# Patient Record
Sex: Female | Born: 1955 | Race: Black or African American | Hispanic: No | Marital: Single | Smoking: Never smoker
Health system: Southern US, Community
[De-identification: ages and names within clinical notes are randomized; demographics above are authoritative.]

## PROBLEM LIST (undated history)

## (undated) DIAGNOSIS — E559 Vitamin D deficiency, unspecified: Secondary | ICD-10-CM

## (undated) HISTORY — PX: CHOLECYSTECTOMY: SHX55

## (undated) HISTORY — PX: ENDOMETRIAL ABLATION: SHX621

## (undated) HISTORY — DX: Vitamin D deficiency, unspecified: E55.9

---

## 2001-05-05 ENCOUNTER — Emergency Department (HOSPITAL_COMMUNITY): Admission: EM | Admit: 2001-05-05 | Discharge: 2001-05-06 | Payer: Self-pay | Admitting: *Deleted

## 2001-05-06 ENCOUNTER — Encounter: Payer: Self-pay | Admitting: *Deleted

## 2003-08-14 ENCOUNTER — Encounter: Admission: RE | Admit: 2003-08-14 | Discharge: 2003-08-14 | Payer: Self-pay | Admitting: Gynecology

## 2003-08-14 ENCOUNTER — Other Ambulatory Visit: Admission: RE | Admit: 2003-08-14 | Discharge: 2003-08-14 | Payer: Self-pay | Admitting: Gynecology

## 2003-09-11 ENCOUNTER — Ambulatory Visit (HOSPITAL_COMMUNITY): Admission: RE | Admit: 2003-09-11 | Discharge: 2003-09-11 | Payer: Self-pay | Admitting: Gastroenterology

## 2003-09-15 ENCOUNTER — Ambulatory Visit (HOSPITAL_COMMUNITY): Admission: RE | Admit: 2003-09-15 | Discharge: 2003-09-15 | Payer: Self-pay | Admitting: Gynecology

## 2003-09-15 ENCOUNTER — Ambulatory Visit (HOSPITAL_BASED_OUTPATIENT_CLINIC_OR_DEPARTMENT_OTHER): Admission: RE | Admit: 2003-09-15 | Discharge: 2003-09-15 | Payer: Self-pay | Admitting: Gynecology

## 2008-04-03 ENCOUNTER — Encounter: Admission: RE | Admit: 2008-04-03 | Discharge: 2008-04-03 | Payer: Self-pay | Admitting: Otolaryngology

## 2009-10-09 ENCOUNTER — Other Ambulatory Visit: Admission: RE | Admit: 2009-10-09 | Discharge: 2009-10-09 | Payer: Self-pay | Admitting: Gynecology

## 2009-10-09 ENCOUNTER — Ambulatory Visit: Payer: Self-pay | Admitting: Gynecology

## 2009-10-12 ENCOUNTER — Ambulatory Visit: Payer: Self-pay | Admitting: Gynecology

## 2009-11-20 ENCOUNTER — Ambulatory Visit: Payer: Self-pay | Admitting: Gynecology

## 2010-10-14 ENCOUNTER — Other Ambulatory Visit: Payer: Self-pay | Admitting: Gynecology

## 2010-10-14 ENCOUNTER — Encounter (INDEPENDENT_AMBULATORY_CARE_PROVIDER_SITE_OTHER): Payer: 59 | Admitting: Gynecology

## 2010-10-14 ENCOUNTER — Other Ambulatory Visit (HOSPITAL_COMMUNITY)
Admission: RE | Admit: 2010-10-14 | Discharge: 2010-10-14 | Disposition: A | Discharge status: Home / Self Care | Payer: 59 | Source: Ambulatory Visit | Attending: Gynecology | Admitting: Gynecology

## 2010-10-14 DIAGNOSIS — Z124 Encounter for screening for malignant neoplasm of cervix: Secondary | ICD-10-CM | POA: Insufficient documentation

## 2010-10-14 DIAGNOSIS — Z1211 Encounter for screening for malignant neoplasm of colon: Secondary | ICD-10-CM

## 2010-10-14 DIAGNOSIS — Z01419 Encounter for gynecological examination (general) (routine) without abnormal findings: Secondary | ICD-10-CM

## 2011-09-23 ENCOUNTER — Encounter: Payer: Self-pay | Admitting: Gynecology

## 2011-10-15 ENCOUNTER — Encounter: Payer: Self-pay | Admitting: Gynecology

## 2011-10-15 ENCOUNTER — Ambulatory Visit (INDEPENDENT_AMBULATORY_CARE_PROVIDER_SITE_OTHER): Payer: 59 | Admitting: Gynecology

## 2011-10-15 VITALS — BP 130/86 | Ht 63.5 in | Wt 183.0 lb

## 2011-10-15 DIAGNOSIS — Z01419 Encounter for gynecological examination (general) (routine) without abnormal findings: Secondary | ICD-10-CM

## 2011-10-15 DIAGNOSIS — N898 Other specified noninflammatory disorders of vagina: Secondary | ICD-10-CM

## 2011-10-15 DIAGNOSIS — E119 Type 2 diabetes mellitus without complications: Secondary | ICD-10-CM | POA: Insufficient documentation

## 2011-10-15 LAB — WET PREP FOR TRICH, YEAST, CLUE
Clue Cells Wet Prep HPF POC: NONE SEEN
Trich, Wet Prep: NONE SEEN
Yeast Wet Prep HPF POC: NONE SEEN

## 2011-10-15 NOTE — Patient Instructions (Signed)
Patient information: High cholesterol (The Basics)  What is cholesterol? -- Cholesterol is a substance that is found in the blood. Everyone has some. It is needed for good health. The problem is, people sometimes have too much cholesterol. Compared with people with normal cholesterol, people with high cholesterol have a higher risk of heart attacks, strokes, and other health problems. The higher your cholesterol, the higher your risk of these problems.  Are there different types of cholesterol? -- Yes, there are a few different types. If you get a cholesterol test, you may hear your doctor or nurse talk about: Total cholesterol  LDL cholesterol - Some people call this the "bad" cholesterol. That's because having high LDL levels raises your risk of heart attacks, strokes, and other health problems.  HDL cholesterol - Some people call this the "good" cholesterol. That's because having high HDL levels lowers your risk of heart attacks, strokes, and other health problems.  Non-HDL cholesterol - Non-HDL cholesterol is your total cholesterol minus your HDL cholesterol.  Triglycerides - Triglycerides are not cholesterol. They are a type of fat. But they often get measured when cholesterol is measured. (Having high triglycerides also seems to increase the risk of heart attacks and strokes.)  What should my numbers be? -- Ask your doctor or nurse what your numbers should be. Different people need different goals. (If you live outside the [place], see (table 1)). In general, people who do not already have heart disease should aim for: Total cholesterol below 200  LDL cholesterol below 130 - or much lower, if they are at risk of heart attacks or strokes  HDL cholesterol above 60  Non-HDL cholesterol below 160 - or lower, if they are at risk of heart attacks or strokes  Triglycerides below 150 Keep in mind, though, that many people who cannot meet these goals still have a low risk of  heart attacks and strokes. What should I do if my doctor tells me I have high cholesterol? -- Ask your doctor what your overall risk of heart attacks and strokes is. High cholesterol, by itself, is not always a reason to worry. Having high cholesterol is just one of many things that can increase your risk of heart attacks and strokes. Other factors that increase your risk include:  Cigarette smoking  High blood pressure  Having a parent, sister, or brother who got heart disease at a young age (Young, in this case, means younger than 3 for men and younger than 30 for women.)  Being a man (Women are at risk, too, but men have a higher risk.)  Older age  If you are at high risk of heart attacks and strokes, having high cholesterol is a problem. On the other hand, if you have are at low risk, having high cholesterol may not mean much. Should I take medicine to lower cholesterol? -- Not everyone who has high cholesterol needs medicines. Your doctor or nurse will decide if you need them based on your age, family history, and other health concerns.  You should probably take a cholesterol-lowering medicine called a statin if you: Already had a heart attack or stroke  Have known heart disease  Have diabetes  Have a condition called peripheral artery disease, which makes it painful to walk, and happens when the arteries in your legs get clogged with fatty deposits  Have an abdominal aortic aneurysm, which is a widening of the main artery in the belly  Most people with any of the conditions  listed above should take a statin no matter what their cholesterol level is. If your doctor or nurse puts you on a statin, stay on it. The medicine may not make you feel any different. But it can help prevent heart attacks, strokes, and death.  Can I lower my cholesterol without medicines? -- Yes, you can lower your cholesterol some by:  Avoiding red meat, butter, fried foods, cheese, and other foods that have a lot of  saturated fat  Losing weight (if you are overweight)  Being more active Even if these steps do little to change your cholesterol, they can improve your health in many ways.                                                         Patient information: Preventing type 2 diabetes (The Basics)  Can type 2 diabetes be prevented? -- Yes! Studies show that people who are at risk can prevent type 2 diabetes by:  Losing weight (if they are overweight)  Being active  Improving the way they eat  Taking certain medicines (most often one called metformin)  There's some evidence that quitting smoking also lowers the risk of developing type 2 diabetes, but scientists need to do more studies to be sure. Even so, there are plenty of good reasons to quit. Quitting smoking lowers your risk of stroke, heart disease, and lots of other problems.  What increases my risk for type 2 diabetes? -- There are a few factors that can increase your risk of diabetes, including: Being overweight or obese, especially if you carry your extra weight in your belly (as opposed to in your hips, thighs, and butt)  Not doing enough physical activity  Smoking  Having a family history of diabetes  Having diabetes during pregnancy, called "gestational diabetes" (if you are a woman) Plus, Asian, Latino, or black people are more likely to get diabetes than white people.  Are there tests that can find people who are at risk? -- Yes. There are 3 different tests that can help doctors tell whether a person might develop type 2 diabetes. All 3 tests measure blood sugar in different ways. "Blood glucose" is another name for blood sugar.  Even though these tests can help predict diabetes, they are not appropriate for everyone. Your doctor or nurse will decide if one of these tests is right for you. Often, people who get tested are overweight and have one other risk factor for diabetes. Examples of risk factors include having a history of diabetes  during pregnancy or a family history of diabetes.  If a blood test shows that a person's blood sugar is higher than normal but not high enough to be called diabetes, doctors call it "pre-diabetes." People with pre-diabetes are at high risk of developing diabetes.  Fasting glucose test - This test measures your blood sugar when you have not had anything to eat or drink (except water) for 8 hours. People with pre-diabetes have a fasting glucose between 100 and 125 . Glucose tolerance test - For this test you do not eat or drink anything for 8 to 12 hours. But then, as part of the test, you have a sugary drink. Two hours later, a doctor or nurse takes a blood sample to see how high your blood sugar got. People  with prediabetes have glucose tolerance results between 140 and 199 .  Hemoglobin A1C test (also called HbA1C or A1C) - For this test it does not matter whether you eat beforehand. It is a blood test that shows what your average blood sugar level has been for the past 2 to 3 months. People with pre-diabetes have A1C levels between 5.7 and 6.4.  What should I do if I have pre-diabetes? -- If you have pre-diabetes, make lifestyle changes to reduce the chance that you will get full-blown diabetes. Here's what you should do:  Lose weight - Losing 5 to 10 percent of your body weight can lower your risk a lot. If you weigh 200 pounds, that means you should lose 10 to 20 pounds. If you weigh 150 pounds, that means you should lose 7 to 15 pounds.  Eat right - Choose a diet rich in fruits, vegetables, and low-fat dairy products, but low in meats, sweets, and refined grains. Stay away from sweet drinks, like soda and juice.  Be active for 30 minutes a day - You don't have to go to the gym or break a sweat to get a benefit. Walking, gardening, and dancing are all activities that can help.  Quit smoking - If you smoke, ask your doctor or nurse for advice on how to quit. People are much more likely to succeed if they  have help and get medicines to help them quit.  Take your medicines -- If your doctor or nurse prescribed any medicines, take them every day, as directed. That goes for medicines to prevent diabetes, and for ones to lower blood pressure or cholesterol. People with pre-diabetes have a higher-than-average risk of heart attacks, strokes, and other problems, so those medicines are important.  Reynaldo Minium [id number] AMTD@  Exercise to Lose Weight Exercise and a healthy diet may help you lose weight. Your doctor may suggest specific exercises. EXERCISE IDEAS AND TIPS  Choose low-cost things you enjoy doing, such as walking, bicycling, or exercising to workout videos.   Take stairs instead of the elevator.   Walk during your lunch break.   Park your car further away from work or school.   Go to a gym or an exercise class.   Start with 5 to 10 minutes of exercise each day. Build up to 30 minutes of exercise 4 to 6 days a week.   Wear shoes with good support and comfortable clothes.   Stretch before and after working out.   Work out until you breathe harder and your heart beats faster.   Drink extra water when you exercise.   Do not do so much that you hurt yourself, feel dizzy, or get very short of breath.  Exercises that burn about 150 calories:  Running 1  miles in 15 minutes.   Playing volleyball for 45 to 60 minutes.   Washing and waxing a car for 45 to 60 minutes.   Playing touch football for 45 minutes.   Walking 1  miles in 35 minutes.   Pushing a stroller 1  miles in 30 minutes.   Playing basketball for 30 minutes.   Raking leaves for 30 minutes.   Bicycling 5 miles in 30 minutes.   Walking 2 miles in 30 minutes.   Dancing for 30 minutes.   Shoveling snow for 15 minutes.   Swimming laps for 20 minutes.   Walking up stairs for 15 minutes.   Bicycling 4 miles in 15 minutes.   Gardening for 30  to 45 minutes.   Jumping rope for 15 minutes.   Washing  windows or floors for 45 to 60 minutes.  Document Released: 07/05/2010 Document Revised: 02/12/2011 Document Reviewed: 07/05/2010 [facility] Patient Information 2012 [place], [place]. Exercise to Lose Weight Exercise and a healthy diet may help you lose weight. Your doctor may suggest specific exercises. EXERCISE IDEAS AND TIPS  Choose low-cost things you enjoy doing, such as walking, bicycling, or exercising to workout videos.   Take stairs instead of the elevator.   Walk during your lunch break.   Park your car further away from work or school.   Go to a gym or an exercise class.   Start with 5 to 10 minutes of exercise each day. Build up to 30 minutes of exercise 4 to 6 days a week.   Wear shoes with good support and comfortable clothes.   Stretch before and after working out.   Work out until you breathe harder and your heart beats faster.   Drink extra water when you exercise.   Do not do so much that you hurt yourself, feel dizzy, or get very short of breath.  Exercises that burn about 150 calories:  Running 1  miles in 15 minutes.   Playing volleyball for 45 to 60 minutes.   Washing and waxing a car for 45 to 60 minutes.   Playing touch football for 45 minutes.   Walking 1  miles in 35 minutes.   Pushing a stroller 1  miles in 30 minutes.   Playing basketball for 30 minutes.   Raking leaves for 30 minutes.   Bicycling 5 miles in 30 minutes.   Walking 2 miles in 30 minutes.   Dancing for 30 minutes.   Shoveling snow for 15 minutes.   Swimming laps for 20 minutes.   Walking up stairs for 15 minutes.   Bicycling 4 miles in 15 minutes.   Gardening for 30 to 45 minutes.   Jumping rope for 15 minutes.   Washing windows or floors for 45 to 60 minutes.  Document Released: 07/05/2010 Document Revised: 02/12/2011 Document Reviewed: 07/05/2010 [facility] Patient Information 2012 [place], [place].   Menopause Menopause is the normal time of life when  menstrual periods stop completely. Menopause is complete when you have missed 12 consecutive menstrual periods. It usually occurs between the ages of 55 to 6, with an average age of 59. Very rarely does a woman develop menopause before 54 years old. At menopause, your ovaries stop producing the female hormones, estrogen and progesterone. This can cause undesirable symptoms and also affect your health. Sometimes the symptoms may occur 4 to 5 years before the menopause begins. There is no relationship between menopause and:  Oral contraceptives.   Number of children you had.   Race.   The age your menstrual periods started (menarche).  Heavy smokers and very thin women may develop menopause earlier in life. CAUSES  The ovaries stop producing the female hormones estrogen and progesterone.   Other causes include:   Surgery to remove both ovaries.   The ovaries stop functioning for no known reason.   Tumors of the pituitary gland in the brain.   Medical disease that affects the ovaries and hormone production.   Radiation treatment to the abdomen or pelvis.   Chemotherapy that affects the ovaries.  SYMPTOMS   Hot flashes.   Night sweats.   Decrease in sex drive.   Vaginal dryness and thinning of the vagina causing painful intercourse.  Dryness of the skin and developing wrinkles.   Headaches.   Tiredness.   Irritability.   Memory problems.   Weight gain.   Bladder infections.   Hair growth of the face and chest.   Infertility.  More serious symptoms include:  Loss of bone (osteoporosis) causing breaks (fractures).   Depression.   Hardening and narrowing of the arteries (atherosclerosis) causing heart attacks and strokes.  DIAGNOSIS   When the menstrual periods have stopped for 12 straight months.   Physical exam.   Hormone studies of the blood.  TREATMENT  There are many treatment choices and nearly as many questions about them. The decisions to treat  or not to treat menopausal changes is an individual choice made with your caregiver. Your caregiver can discuss the treatments with you. Together, you can decide which treatment will work best for you. Your treatment choices may include:   Hormone therapy (estorgen and progesterone).   Non-hormonal medications.   Treating the individual symptoms with medication (for example antidepressants for depression).   Herbal medications that may help specific symptoms.   Counseling by a psychiatrist or psychologist.   Group therapy.   Lifestyle changes including:   Eating healthy.   Regular exercise.   Limiting caffeine and alcohol.   Stress management and meditation.   No treatment.  HOME CARE INSTRUCTIONS   Take the medication your caregiver gives you as directed.   Get plenty of sleep and rest.   Exercise regularly.   Eat a diet that contains calcium (good for the bones) and soy products (acts like estrogen hormone).   Avoid alcoholic beverages.   Do not smoke.   If you have hot flashes, dress in layers.   Take supplements, calcium and vitamin D to strengthen bones.   You can use over-the-counter lubricants or moisturizers for vaginal dryness.   Group therapy is sometimes very helpful.   Acupuncture may be helpful in some cases.  SEEK MEDICAL CARE IF:   You are not sure you are in menopause.   You are having menopausal symptoms and need advice and treatment.   You are still having menstrual periods after age 74.   You have pain with intercourse.   Menopause is complete (no menstrual period for 12 months) and you develop vaginal bleeding.   You need a referral to a specialist (gynecologist, psychiatrist or psychologist) for treatment.  SEEK IMMEDIATE MEDICAL CARE IF:   You have severe depression.   You have excessive vaginal bleeding.   You fell and think you have a broken bone.   You have pain when you urinate.   You develop leg or chest pain.   You  have a fast pounding heart beat (palpitations).   You have severe headaches.   You develop vision problems.   You feel a lump in your breast.   You have abdominal pain or severe indigestion.  Document Released: 08/23/2003 Document Revised: 05/22/2011 Document Reviewed: 03/30/2008 [facility] Patient Information 2012 [place], [place].  Hormone Therapy At menopause, your body begins making less estrogen and progesterone hormones. This causes the body to stop having menstrual periods. This is because estrogen and progesterone hormones control your periods and menstrual cycle. A lack of estrogen may cause symptoms such as:  Hot flushes (or hot flashes).   Vaginal dryness.   Dry skin.   Loss of sex drive.   Risk of bone loss (osteoporosis).  When this happens, you may choose to take hormone therapy to get back the estrogen  lost during menopause. When the hormone estrogen is given alone, it is usually referred to as ET (Estrogen Therapy). When the hormone progestin is combined with estrogen, it is generally called HT (Hormone Therapy). This was formerly known as hormone replacement therapy (HRT). Your caregiver can help you make a decision on what will be best for you. The decision to use HT seems to change often as new studies are done. Many studies do not agree on the benefits of hormone replacement therapy. LIKELY BENEFITS OF HT INCLUDE PROTECTION FROM:  Hot Flushes (also called hot flashes) - A hot flush is a sudden feeling of heat that spreads over the face and body. The skin may redden like a blush. It is connected with sweats and sleep disturbance. Women going through menopause may have hot flushes a few times a month or several times per day depending on the woman.   Osteoporosis (bone loss)- Estrogen helps guard against bone loss. After menopause, a woman's bones slowly lose calcium and become weak and brittle. As a result, bones are more likely to break. The hip, wrist, and spine are  affected most often. Hormone therapy can help slow bone loss after menopause. Weight bearing exercise and taking calcium with vitamin D also can help prevent bone loss. There are also medications that your caregiver can prescribe that can help prevent osteoporosis.   Vaginal Dryness - Loss of estrogen causes changes in the vagina. Its lining may become thin and dry. These changes can cause pain and bleeding during sexual intercourse. Dryness can also lead to infections. This can cause burning and itching. (Vaginal estrogen treatment can help relieve pain, itching, and dryness.)   Urinary Tract Infections are more common after menopause because of lack of estrogen. Some women also develop urinary incontinence because of low estrogen levels in the vagina and bladder.   Possible other benefits of estrogen include a positive effect on mood and short-term memory in women.  RISKS AND COMPLICATIONS  Using estrogen alone without progesterone causes the lining of the uterus to grow. This increases the risk of lining of the uterus (endometrial) cancer. Your caregiver should give another hormone called progestin if you have a uterus.   Women who take combined (estrogen and progestin) HT appear to have an increased risk of breast cancer. The risk appears to be small, but increases throughout the time that HT is taken.   Combined therapy also makes the breast tissue slightly denser which makes it harder to read mammograms (breast X-rays).   Combined, estrogen and progesterone therapy can be taken together every day, in which case there may be spotting of blood. HT therapy can be taken cyclically in which case you will have menstrual periods. Cyclically means HT is taken for a set amount of days, then not taken, then this process is repeated.   HT may increase the risk of stroke, heart attack, breast cancer and forming blood clots in your leg.   Transdermal estrogen (estrogen that is absorbed through the skin  with a patch or a cream) may have more positive results with:   Cholesterol.   Blood pressure.   Blood clots.  Having the following conditions may indicate you should not have HT:  Endometrial cancer.   Liver disease.   Breast cancer.   Heart disease.   History of blood clots.   Stroke.  TREATMENT   If you choose to take HT and have a uterus, usually estrogen and progestin are prescribed.   Your caregiver  will help you decide the best way to take the medications.   Possible ways to take estrogen include:   Pills.   Patches.   Gels.   Sprays.   Vaginal estrogen cream, rings and tablets.   It is best to take the lowest dose possible that will help your symptoms and take them for the shortest period of time that you can.   Hormone therapy can help relieve some of the problems (symptoms) that affect women at menopause. Before making a decision about HT, talk to your caregiver about what is best for you. Be well informed and comfortable with your decisions.  HOME CARE INSTRUCTIONS   Follow your caregivers advice when taking the medications.   A Pap test is done to screen for cervical cancer.   The first Pap test should be done at age 38.   Between ages 47 and 2, Pap tests are repeated every 2 years.   Beginning at age 44, you are advised to have a Pap test every 3 years as long as your past 3 Pap tests have been normal.   Some women have medical problems that increase the chance of getting cervical cancer. Talk to your caregiver about these problems. It is especially important to talk to your caregiver if a new problem develops soon after your last Pap test. In these cases, your caregiver may recommend more frequent screening and Pap tests.   The above recommendations are the same for women who have or have not gotten the vaccine for HPV (Human Papillomavirus).   If you had a hysterectomy for a problem that was not a cancer or a condition that could lead to  cancer, then you no longer need Pap tests. However, even if you no longer need a Pap test, a regular exam is a good idea to make sure no other problems are starting.    If you are between ages 9 and 68, and you have had normal Pap tests going back 10 years, you no longer need Pap tests. However, even if you no longer need a Pap test, a regular exam is a good idea to make sure no other problems are starting.    If you have had past treatment for cervical cancer or a condition that could lead to cancer, you need Pap tests and screening for cancer for at least 20 years after your treatment.   If Pap tests have been discontinued, risk factors (such as a new sexual partner) need to be re-assessed to determine if screening should be resumed.   Some women may need screenings more often if they are at high risk for cervical cancer.   Get mammograms done as per the advice of your caregiver.  SEEK IMMEDIATE MEDICAL CARE IF:  You develop abnormal vaginal bleeding.   You have pain or swelling in your legs, shortness of breath, or chest pain.   You develop dizziness or headaches.   You have lumps or changes in your breasts or armpits.   You have slurred speech.   You develop weakness or numbness of your arms or legs.   You have pain, burning, or bleeding when urinating.   You develop abdominal pain.  Document Released: 03/01/2003 Document Revised: 05/22/2011 Document Reviewed: 06/19/2010 [facility] Patient Information 2012 [place], [place].

## 2011-10-15 NOTE — Progress Notes (Signed)
Kristina Hoffman 03-25-1956 [id number]   History:    56 y.o.  for annual exam with no major complaints. Patient was weighing 167 is up to 183. Her last mammogram was April of this year which was normal. Patient frequently does her self breast examinations. No prior history of abnormal Pap smears. Last Pap smear in 2012. She is currently taking multivitamin. Patient is being followed by her primary physician Dr. Sharl Ma for her type 2 diabetes and had all her lab work done last week and has an appointment to see him her last colonoscopy was normal in 2011. to discuss these results in the next few days. Patient several years ago had endometrial ablation.  Past medical history,surgical history, family history and social history were all reviewed and documented in the EPIC chart.  Gynecologic History Patient's last menstrual period was 10/15/2003. Contraception: none Last Pap: 2012. Results were: normal Last mammogram: 2013. Results were: normal  Obstetric History OB History    Grav Para Term Preterm Abortions TAB SAB Ect Mult Living   3 3 2       2      # Outc Date GA Lbr Len/2nd Wgt Sex Del Anes PTL Lv   1 PAR      SVD  No SB   2 TRM     F SVD  No Yes   3 TRM     M CS  No Yes       ROS:  Was performed and pertinent positives and negatives are included in the history.  Exam: chaperone present  BP 130/86  Ht 5' 3.5" (1.613 m)  Wt 183 lb (83.008 kg)  BMI 31.91 kg/m2  LMP 10/15/2003  Body mass index is 31.91 kg/(m^2).  General appearance : Well developed well nourished female. No acute distress HEENT: Neck supple, trachea midline, no carotid bruits, no thyroidmegaly Lungs: Clear to auscultation, no rhonchi or wheezes, or rib retractions  Heart: Regular rate and rhythm, no murmurs or gallops Breast:Examined in sitting and supine position were symmetrical in appearance, no palpable masses or tenderness,  no skin retraction, no nipple inversion, no nipple discharge, no skin  discoloration, no axillary or supraclavicular lymphadenopathy Abdomen: no palpable masses or tenderness, no rebound or guarding Extremities: no edema or skin discoloration or tenderness  Pelvic:  Bartholin, Urethra, Skene Glands: Within normal limits             Vagina: No gross lesions or discharge  Cervix: No gross lesions or discharge  Uterus  anteverted, normal size, shape and consistency, non-tender and mobile  Adnexa  Without masses or tenderness  Anus and perineum  normal   Rectovaginal  normal sphincter tone without palpated masses or tenderness             Hemoccult cards and provided to the patient to submit to the office for testing     Assessment/Plan:  56 y.o. female for annual exam who was given information today on the menopause as well as on hormone replacement therapy and diet and exercise. We discussed importance of osteoporosis prevention with regular exercise as well as calcium and vitamin D supplementation. We discussed a new screening guidelines for Pap smears and she will not need one for 2 more years. She was encouraged to do her monthly self breast examination. When she becomes symptomatic in the menopause will sit down and discuss different treatment options to improve her quality of life. At time of her pelvic exam a slight white discharge was  noted a wet prep was done and was negative.    Ok Edwards MD, 5:17 PM 10/15/2011

## 2012-10-15 ENCOUNTER — Ambulatory Visit (INDEPENDENT_AMBULATORY_CARE_PROVIDER_SITE_OTHER): Payer: 59 | Admitting: Gynecology

## 2012-10-15 ENCOUNTER — Encounter: Payer: Self-pay | Admitting: Gynecology

## 2012-10-15 VITALS — BP 136/88 | Ht 64.0 in | Wt 179.0 lb

## 2012-10-15 DIAGNOSIS — Z23 Encounter for immunization: Secondary | ICD-10-CM

## 2012-10-15 DIAGNOSIS — Z1159 Encounter for screening for other viral diseases: Secondary | ICD-10-CM

## 2012-10-15 DIAGNOSIS — Z780 Asymptomatic menopausal state: Secondary | ICD-10-CM

## 2012-10-15 DIAGNOSIS — Z01419 Encounter for gynecological examination (general) (routine) without abnormal findings: Secondary | ICD-10-CM

## 2012-10-15 NOTE — Progress Notes (Signed)
Kristina Hoffman [birth year] [id number]   History:    57 y.o.  for annual gyn exam with no complaints today. Patient's primary physician at [facility] has been doing her lab work. Review of her record indicated her last bone density study was normal in 2011. Patient with prior history endometrial ablation and is having no menses. She had a normal colonoscopy in 2011. Patient denies any menopausal symptoms. Patient with no history of abnormal Pap smears. Patient had gone to [facility] 2 years ago and had tubes placed in her left year and she continues to have balance issue but has not seen a neurologist.  Past medical history,surgical history, family history and social history were all reviewed and documented in the EPIC chart.  Gynecologic History Patient's last menstrual period was 10/15/2003. Contraception: post menopausal status Last Pap: 2012. Results were: normal Last mammogram: April 2013 Results were: normal  Obstetric History OB History   Grav Para Term Preterm Abortions TAB SAB Ect Mult Living   3 3 2       2      # Outc Date GA Lbr Len/2nd Wgt Sex Del Anes PTL Lv   1 PAR      SVD  No SB   2 TRM     F SVD  No Yes   3 TRM     M CS  No Yes       ROS: A ROS was performed and pertinent positives and negatives are included in the history.  GENERAL: No fevers or chills. HEENT: No change in vision, no earache, sore throat or sinus congestion. NECK: No pain or stiffness. CARDIOVASCULAR: No chest pain or pressure. No palpitations. PULMONARY: No shortness of breath, cough or wheeze. GASTROINTESTINAL: No abdominal pain, nausea, vomiting or diarrhea, melena or bright red blood per rectum. GENITOURINARY: No urinary frequency, urgency, hesitancy or dysuria. MUSCULOSKELETAL: No joint or muscle pain, no back pain, no recent trauma. DERMATOLOGIC: No rash, no itching, no lesions. ENDOCRINE: No polyuria, polydipsia, no heat or cold intolerance. No recent change in weight.  HEMATOLOGICAL: No anemia or easy bruising or bleeding. NEUROLOGIC: No headache, seizures, numbness, tingling or weakness. PSYCHIATRIC: No depression, no loss of interest in normal activity or change in sleep pattern.     Exam: chaperone present  BP 136/88  Ht 5\' 4"  (1.626 m)  Wt 179 lb (81.194 kg)  BMI 30.71 kg/m2  LMP 10/15/2003  Body mass index is 30.71 kg/(m^2).  General appearance : Well developed well nourished female. No acute distress HEENT: Neck supple, trachea midline, no carotid bruits, no thyroidmegaly Lungs: Clear to auscultation, no rhonchi or wheezes, or rib retractions  Heart: Regular rate and rhythm, no murmurs or gallops Breast:Examined in sitting and supine position were symmetrical in appearance, no palpable masses or tenderness,  no skin retraction, no nipple inversion, no nipple discharge, no skin discoloration, no axillary or supraclavicular lymphadenopathy Abdomen: no palpable masses or tenderness, no rebound or guarding Extremities: no edema or skin discoloration or tenderness  Pelvic:  Bartholin, Urethra, Skene Glands: Within normal limits             Vagina: No gross lesions or discharge  Cervix: No gross lesions or discharge  Uterus  anteverted, normal size, shape and consistency, non-tender and mobile  Adnexa  Without masses or tenderness  Anus and perineum  normal   Rectovaginal  normal sphincter tone without palpated masses or tenderness  Hemoccult Hemoccult cards provided     Assessment/Plan:  57 y.o. female for annual exam with no menopausal symptoms. Patient with prior history of endometrial ablation. Patient with no menses. Patient will be referred to a neurologist for further evaluation of her balance issues. She has seen two ENT's in the past and I believe she needs further evaluation with a neurologist. Patient received a Tdap vaccine today. She will schedule her bone density study. Prescription was given for her to obtain her shingles  vaccine. She was reminded to submit 2 the office Hemoccult cards for testing. We discussed importance of calcium vitamin D for osteoporosis prevention. No Pap smear done today new guidelines discussed.  New CDC guidelines is recommending patients be tested once in her lifetime for hepatitis C antibody who were born between [birth year] through 1963. This was discussed with the patient today and has agreed to be tested today.    Ok Edwards MD, 5:35 PM 10/15/2012

## 2012-10-15 NOTE — Patient Instructions (Addendum)
Tetanus, Diphtheria, Pertussis (Tdap) Vaccine What You Need to Know WHY GET VACCINATED? Tetanus, diphtheria and pertussis can be very serious diseases, even for adolescents and adults. Tdap vaccine can protect us from these diseases. TETANUS (Lockjaw) causes painful muscle tightening and stiffness, usually all over the body.  It can lead to tightening of muscles in the head and neck so you can't open your mouth, swallow, or sometimes even breathe. Tetanus kills about 1 out of 5 people who are infected. DIPHTHERIA can cause a thick coating to form in the back of the throat.  It can lead to breathing problems, paralysis, heart failure, and death. PERTUSSIS (Whooping Cough) causes severe coughing spells, which can cause difficulty breathing, vomiting and disturbed sleep.  It can also lead to weight loss, incontinence, and rib fractures. Up to 2 in 100 adolescents and 5 in 100 adults with pertussis are hospitalized or have complications, which could include pneumonia and death. These diseases are caused by bacteria. Diphtheria and pertussis are spread from person to person through coughing or sneezing. Tetanus enters the body through cuts, scratches, or wounds. Before vaccines, the [place] saw as many as 200,000 cases a year of diphtheria and pertussis, and hundreds of cases of tetanus. Since vaccination began, tetanus and diphtheria have dropped by about 99% and pertussis by about 80%. TDAP VACCINE Tdap vaccine can protect adolescents and adults from tetanus, diphtheria, and pertussis. One dose of Tdap is routinely given at age 11 or 12. People who did not get Tdap at that age should get it as soon as possible. Tdap is especially important for health care professionals and anyone having close contact with a baby younger than 12 months. Pregnant women should get a dose of Tdap during every pregnancy, to protect the newborn from pertussis. Infants are most at risk for severe, life-threatening  complications from pertussis. A similar vaccine, called Td, protects from tetanus and diphtheria, but not pertussis. A Td booster should be given every 10 years. Tdap may be given as one of these boosters if you have not already gotten a dose. Tdap may also be given after a severe cut or burn to prevent tetanus infection. Your doctor can give you more information. Tdap may safely be given at the same time as other vaccines. SOME PEOPLE SHOULD NOT GET THIS VACCINE  If you ever had a life-threatening allergic reaction after a dose of any tetanus, diphtheria, or pertussis containing vaccine, OR if you have a severe allergy to any part of this vaccine, you should not get Tdap. Tell your doctor if you have any severe allergies.  If you had a coma, or long or multiple seizures within 7 days after a childhood dose of DTP or DTaP, you should not get Tdap, unless a cause other than the vaccine was found. You can still get Td.  Talk to your doctor if you:  have epilepsy or another nervous system problem,  had severe pain or swelling after any vaccine containing diphtheria, tetanus or pertussis,  ever had Guillain-Barr Syndrome (GBS),  aren't feeling well on the day the shot is scheduled. RISKS OF A VACCINE REACTION With any medicine, including vaccines, there is a chance of side effects. These are usually mild and go away on their own, but serious reactions are also possible. Brief fainting spells can follow a vaccination, leading to injuries from falling. Sitting or lying down for about 15 minutes can help prevent these. Tell your doctor if you feel dizzy or light-headed, or   have vision changes or ringing in the ears. Mild problems following Tdap (Did not interfere with activities)  Pain where the shot was given (about 3 in 4 adolescents or 2 in 3 adults)  Redness or swelling where the shot was given (about 1 person in 5)  Mild fever of at least 100.4F (up to about 1 in 25 adolescents or 1 in  100 adults)  Headache (about 3 or 4 people in 10)  Tiredness (about 1 person in 3 or 4)  Nausea, vomiting, diarrhea, stomach ache (up to 1 in 4 adolescents or 1 in 10 adults)  Chills, body aches, sore joints, rash, swollen glands (uncommon) Moderate problems following Tdap (Interfered with activities, but did not require medical attention)  Pain where the shot was given (about 1 in 5 adolescents or 1 in 100 adults)  Redness or swelling where the shot was given (up to about 1 in 16 adolescents or 1 in 25 adults)  Fever over 102F (about 1 in 100 adolescents or 1 in 250 adults)  Headache (about 3 in 20 adolescents or 1 in 10 adults)  Nausea, vomiting, diarrhea, stomach ache (up to 1 or 3 people in 100)  Swelling of the entire arm where the shot was given (up to about 3 in 100). Severe problems following Tdap (Unable to perform usual activities, required medical attention)  Swelling, severe pain, bleeding and redness in the arm where the shot was given (rare). A severe allergic reaction could occur after any vaccine (estimated less than 1 in a million doses). WHAT IF THERE IS A SERIOUS REACTION? What should I look for?  Look for anything that concerns you, such as signs of a severe allergic reaction, very high fever, or behavior changes. Signs of a severe allergic reaction can include hives, swelling of the face and throat, difficulty breathing, a fast heartbeat, dizziness, and weakness. These would start a few minutes to a few hours after the vaccination. What should I do?  If you think it is a severe allergic reaction or other emergency that can't wait, call 9-1-1 or get the person to the nearest hospital. Otherwise, call your doctor.  Afterward, the reaction should be reported to the "Vaccine Adverse Event Reporting System" (VAERS). Your doctor might file this report, or you can do it yourself through the VAERS web site at www.vaers.hhs.gov, or by calling 1-800-822-7967. VAERS is  only for reporting reactions. They do not give medical advice.  THE NATIONAL VACCINE INJURY COMPENSATION PROGRAM The National Vaccine Injury Compensation Program (VICP) is a federal program that was created to compensate people who may have been injured by certain vaccines. Persons who believe they may have been injured by a vaccine can learn about the program and about filing a claim by calling 1-800-338-2382 or visiting the VICP website at www.hrsa.gov/vaccinecompensation. HOW CAN I LEARN MORE?  Ask your doctor.  Call your local or state health department.  Contact the Centers for Disease Control and Prevention (CDC):  Call 1-800-232-4636 or visit CDC's website at www.cdc.gov/vaccines CDC Tdap Vaccine VIS (10/23/11) Document Released: 12/02/2011 Document Reviewed: 12/02/2011 ExitCare Patient Information 2013 [employer].  Shingles Vaccine What You Need to Know WHAT IS SHINGLES?  Shingles is a painful skin rash, often with blisters. It is also called Herpes Zoster or just Zoster.  A shingles rash usually appears on one side of the face or body and lasts from 2 to 4 weeks. Its main symptom is pain, which can be quite severe. Other symptoms of   shingles can include fever, headache, chills, and upset stomach. Very rarely, a shingles infection can lead to pneumonia, hearing problems, blindness, brain inflammation (encephalitis), or death.  For about 1 person in 5, severe pain can continue even after the rash clears up. This is called post-herpetic neuralgia.  Shingles is caused by the Varicella Zoster virus. This is the same virus that causes chickenpox. Only someone who has had a case of chickenpox or rarely, has gotten chickenpox vaccine, can get shingles. The virus stays in your body. It can reappear many years later to cause a case of shingles.  You cannot catch shingles from another person with shingles. However, a person who has never had chickenpox (or chickenpox vaccine) could get  chickenpox from someone with shingles. This is not very common.  Shingles is far more common in people 50 and older than in younger people. It is also more common in people whose immune systems are weakened because of a disease such as cancer or drugs such as steroids or chemotherapy.  At least 1 million people get shingles per year in the [place]. SHINGLES VACCINE  A vaccine for shingles was licensed in 2006. In clinical trials, the vaccine reduced the risk of shingles by 50%. It can also reduce the pain in people who still get shingles after being vaccinated.  A single dose of shingles vaccine is recommended for adults 60 years of age and older. SOME PEOPLE SHOULD NOT GET SHINGLES VACCINE OR SHOULD WAIT A person should not get shingles vaccine if he or she:  Has ever had a life-threatening allergic reaction to gelatin, the antibiotic neomycin, or any other component of shingles vaccine. Tell your caregiver if you have any severe allergies.  Has a weakened immune system because of current:  AIDS or another disease that affects the immune system.  Treatment with drugs that affect the immune system, such as prolonged use of high-dose steroids.  Cancer treatment, such as radiation or chemotherapy.  Cancer affecting the bone marrow or lymphatic system, such as leukemia or lymphoma.  Is pregnant, or might be pregnant. Women should not become pregnant until at least 4 weeks after getting shingles vaccine. Someone with a minor illness, such as a cold, may be vaccinated. Anyone with a moderate or severe acute illness should usually wait until he or she recovers before getting the vaccine. This includes anyone with a temperature of 101.3 F (38 C) or higher. WHAT ARE THE RISKS FROM SHINGLES VACCINE?  A vaccine, like any medicine, could possibly cause serious problems, such as severe allergic reactions. However, the risk of a vaccine causing serious harm, or death, is extremely  small.  No serious problems have been identified with shingles vaccine. Mild Problems  Redness, soreness, swelling, or itching at the site of the injection (about 1 person in 3).  Headache (about 1 person in 70). Like all vaccines, shingles vaccine is being closely monitored for unusual or severe problems. WHAT IF THERE IS A MODERATE OR SEVERE REACTION? What should I look for? Any unusual condition, such as a severe allergic reaction or a high fever. If a severe allergic reaction occurred, it would be within a few minutes to an hour after the shot. Signs of a serious allergic reaction can include difficulty breathing, weakness, hoarseness or wheezing, a fast heartbeat, hives, dizziness, paleness, or swelling of the throat. What should I do?  Call your caregiver, or get the person to a caregiver right away.  Tell the caregiver what happened,   the date and time it happened, and when the vaccination was given.  Ask the caregiver to report the reaction by filing a Vaccine Adverse Event Reporting System (VAERS) form. Or, you can file this report through the VAERS web site at www.vaers.hhs.gov or by calling 1-800-822-7967. VAERS does not provide medical advice. HOW CAN I LEARN MORE?  Ask your caregiver. He or she can give you the vaccine package insert or suggest other sources of information.  Contact the Centers for Disease Control and Prevention (CDC):  Call 1-800-232-4636 (1-800-CDC-INFO).  Visit the CDC website at www.cdc.gov/vaccines CDC Shingles Vaccine VIS (03/21/08) Document Released: 03/30/2006 Document Revised: 08/25/2011 Document Reviewed: 03/21/2008 ExitCare Patient Information 2013 [employer].   

## 2012-10-18 ENCOUNTER — Encounter: Payer: Self-pay | Admitting: Gynecology

## 2012-11-04 ENCOUNTER — Telehealth: Payer: Self-pay | Admitting: *Deleted

## 2012-11-04 ENCOUNTER — Other Ambulatory Visit: Payer: Self-pay | Admitting: *Deleted

## 2012-11-04 DIAGNOSIS — Z1159 Encounter for screening for other viral diseases: Secondary | ICD-10-CM

## 2012-11-04 NOTE — Telephone Encounter (Signed)
Pt will come on 5/27 at [facility] to have drawn

## 2012-11-09 ENCOUNTER — Other Ambulatory Visit: Payer: 59

## 2012-11-09 ENCOUNTER — Ambulatory Visit (INDEPENDENT_AMBULATORY_CARE_PROVIDER_SITE_OTHER): Payer: 59

## 2012-11-09 DIAGNOSIS — Z780 Asymptomatic menopausal state: Secondary | ICD-10-CM

## 2012-11-09 DIAGNOSIS — Z1159 Encounter for screening for other viral diseases: Secondary | ICD-10-CM

## 2012-11-09 DIAGNOSIS — Z13820 Encounter for screening for osteoporosis: Secondary | ICD-10-CM

## 2012-11-11 ENCOUNTER — Other Ambulatory Visit: Payer: Self-pay | Admitting: Anesthesiology

## 2012-11-11 DIAGNOSIS — Z1211 Encounter for screening for malignant neoplasm of colon: Secondary | ICD-10-CM

## 2012-11-15 ENCOUNTER — Other Ambulatory Visit: Payer: Self-pay | Admitting: *Deleted

## 2012-11-15 DIAGNOSIS — M898X9 Other specified disorders of bone, unspecified site: Secondary | ICD-10-CM

## 2012-11-17 ENCOUNTER — Other Ambulatory Visit: Payer: 59

## 2012-11-17 DIAGNOSIS — M898X9 Other specified disorders of bone, unspecified site: Secondary | ICD-10-CM

## 2012-11-18 ENCOUNTER — Other Ambulatory Visit: Payer: Self-pay | Admitting: Gynecology

## 2012-11-18 DIAGNOSIS — E559 Vitamin D deficiency, unspecified: Secondary | ICD-10-CM

## 2012-11-18 MED ORDER — ERGOCALCIFEROL 1.25 MG (50000 UT) PO CAPS: 50000.0000 [IU] | ORAL_CAPSULE | ORAL | Status: DC

## 2012-11-19 LAB — PTH, INTACT AND CALCIUM: PTH: 98 pg/mL — ABNORMAL HIGH (ref 14.0–72.0)

## 2012-12-28 ENCOUNTER — Telehealth: Payer: Self-pay | Admitting: *Deleted

## 2012-12-28 DIAGNOSIS — R2689 Other abnormalities of gait and mobility: Secondary | ICD-10-CM

## 2012-12-28 DIAGNOSIS — R42 Dizziness and giddiness: Secondary | ICD-10-CM

## 2012-12-28 NOTE — Telephone Encounter (Signed)
Pt tried to call [facility] to make appointment but unable to because they need a referral. Referral placed the MD will review records and call me with time and date per office.

## 2013-01-07 NOTE — Telephone Encounter (Signed)
appt on 01/19/13 @ 12:30 pm with Dr.Jaffe, pt informed

## 2013-01-19 ENCOUNTER — Encounter: Payer: Self-pay | Admitting: Neurology

## 2013-01-19 ENCOUNTER — Ambulatory Visit (INDEPENDENT_AMBULATORY_CARE_PROVIDER_SITE_OTHER): Payer: 59 | Admitting: Neurology

## 2013-01-19 VITALS — BP 148/84 | HR 88 | Temp 98.1°F | Ht 64.0 in | Wt 179.0 lb

## 2013-01-19 DIAGNOSIS — R42 Dizziness and giddiness: Secondary | ICD-10-CM

## 2013-01-19 NOTE — Progress Notes (Signed)
NEUROLOGY CONSULTATION NOTE  Kristina Hoffman MRN: [id number] DOB: 1956/01/15   Referring provider: Dr. Lily Peer Primary care provider: Dr. Lily Peer  Reason for consult:  Imbalance  HISTORY OF PRESENT ILLNESS: Kristina Hoffman is a 57 y.o. female with history of type II diabetes and hyperlipidemia who presents for evaluation of imbalance.  Records and images were personally reviewed where available.  Symptoms started several years ago.  It initially began as left-sided earache. It then progressed into symptoms consisting of earache, nausea, and gait imbalance. She also would note a fullness in her ear. At times she would have pressure in the back of her head, but no real associated headache. There is no true spinning sensation but she does feel a sense of unsteadiness coming from her head. There is no associated ringing in the ears. She denies numbness in her feet. She denies sweats or palpitations. Usually it will be precipitated by movements or with any change of weather, such as [place] storm. This unsteadiness and nausea is constant but fluctuates in intensity today these triggers. She has been evaluated by ENT twice. She initially sought 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 She reported that she got spontaneously nauseous in the middle of her hearing test. No real explanation was found. She then sought ENT at [facility]. It sounds like that she may have had an ENG, but I'm not sure. Notes are not at my disposal. She did have a tube placed in her left ear however. She has not noted any improvement in her symptoms since this. Sometimes there is an associated lightheadedness but no loss of consciousness. Sometimes, the onset of nausea would be preceded by a feeling of lethargy.  She did have an MRI of the brain with and without contrast performed on 04/03/08. It was unremarkable and did not reveal any CPA lesion or abnormal enhancement of the vestibular  nerves.  She denies history of headaches or migraines.  She does have a family history of hearing loss.  Labs from 10/13/12 reveal nonfasting glucose 194 and Hgb A1c 6.8.  PAST MEDICAL HISTORY: Past Medical History  Diagnosis Date  . Diabetes mellitus     PAST SURGICAL HISTORY: Past Surgical History  Procedure Laterality Date  . Cholecystectomy    . Endometrial ablation      MEDICATIONS: Current Outpatient Prescriptions on File Prior to Visit  Medication Sig Dispense Refill  . aspirin 81 MG tablet Take 81 mg by mouth daily.      . ergocalciferol (VITAMIN D2) 50000 UNITS capsule Take 1 capsule (50,000 Units total) by mouth once a week.  4 capsule  2  . Multiple Vitamin (MULTIVITAMIN) tablet Take 1 tablet by mouth daily.       No current facility-administered medications on file prior to visit.    ALLERGIES: No Known Allergies  FAMILY HISTORY: Family History  Problem Relation Age of Onset  . Hypertension Mother   . Diabetes Father   . Diabetes Sister   . Cancer Maternal Grandmother     ? type    SOCIAL HISTORY: History   Social History  . Marital Status: Married    Spouse Name: N/A    Number of Children: N/A  . Years of Education: N/A   Occupational History  . Not on file.   Social History Main Topics  . Smoking status: Never Smoker   . Smokeless tobacco: Never Used  . Alcohol Use: No  . Drug Use: No  .  Sexually Active: No   Other Topics Concern  . Not on file   Social History Narrative  . No narrative on file    REVIEW OF SYSTEMS: Constitutional: No fevers, chills, or sweats, no generalized fatigue, change in appetite Eyes: No visual changes, double vision, eye pain Ear, nose and throat: No hearing loss, ear pain, nasal congestion, sore throat Cardiovascular: No chest pain, palpitations Respiratory:  No shortness of breath at rest or with exertion, wheezes GastrointestinaI: No nausea, vomiting, diarrhea, abdominal pain, fecal  incontinence Genitourinary:  No dysuria, urinary retention or frequency Musculoskeletal:  No neck pain, back pain Integumentary: No rash, pruritus, skin lesions Neurological: as above Psychiatric: No depression, insomnia, anxiety Endocrine: No palpitations, fatigue, diaphoresis, mood swings, change in appetite, change in weight, increased thirst Hematologic/Lymphatic:  No anemia, purpura, petechiae. Allergic/Immunologic: no itchy/runny eyes, nasal congestion, recent allergic reactions, rashes  PHYSICAL EXAM: Filed Vitals:   01/19/13 1239  BP: 148/84  Pulse: 88  Temp: 98.1 F (36.7 C)   General: No acute distress Head:  Normocephalic/atraumatic Neck: supple, no paraspinal tenderness, full range of motion Back: No paraspinal tenderness Heart: regular rate and rhythm Lungs: Clear to auscultation bilaterally. Vascular: No carotid bruits. Neurological Exam: Mental status: alert and oriented to person, place, time and self, speech fluent and not dysarthric, language intact. Cranial nerves: CN I: not tested CN II: pupils equal, round and reactive to light, visual fields intact, fundi unremarkable. CN III, IV, VI:  full range of motion, no nystagmus, no ptosis CN V: facial sensation intact CN VII: upper and lower face symmetric CN VIII: hearing intact CN IX, X: gag intact, uvula midline CN XI: sternocleidomastoid and trapezius muscles intact CN XII: tongue midline Bulk & Tone: normal, no fasciculations. Motor: 5/5 throughout Sensation: temperature and vibration intact Deep Tendon Reflexes: 2+ throughout, toes down Finger to nose testing: normal without dysmetria Heel to shin: normal without dysmetria Gait: unsteady, mildly ataxic, seems to veer towards the right. Romberg with sway.  IMPRESSION & PLAN: Kristina Hoffman is a 57 y.o. female with episodes of nausea and disequilibrium.  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  Although she  does not present with true vertigo, consider Meniere's disease, given the unilateral hearing loss, ear fullness and imbalance.  I don't have the notes from her prior ENT visits, so I am not sure what tests were performed and what diagnoses they entertained.  She has a non-focal exam and her symptoms don't seem primarily neurological.  She says that symptoms are precipitated with change in weather, and I wonder if the change in pressure triggers the symptoms.  From my standpoint, I am not sure what else I can offer her. 1.  I would like to review the notes from her ENT visits.  We will have her sign a release. 2.  After reviewing the notes, we will contact her with any other suggestions 3.  In the meantime, she should follow a low-salt diet. 4.  Vestibular rehab may be considered.  45 minutes spent with patient, over 50% spent reviewing notes and images, counseling and coordinating care.  Thank you for allowing me to take part in the care of this patient.  Shon Millet, DO  CC:  Reynaldo Minium, MD

## 2013-01-19 NOTE — Patient Instructions (Signed)
At this point, I can't really determine a neurologic cause.  It seems more likely related to inner ear, such as Meniere's disease.  I think we will get the notes from [facility], as well as [facility], to see what was done.  We will contact you regarding if there are any other suggestions.  In the meantime, continue low-salt diet.  Consider vestibular rehab as well.

## 2013-03-01 ENCOUNTER — Encounter: Payer: Self-pay | Admitting: Gynecology

## 2013-12-30 ENCOUNTER — Encounter: Payer: 59 | Admitting: Gynecology

## 2014-01-02 ENCOUNTER — Ambulatory Visit (INDEPENDENT_AMBULATORY_CARE_PROVIDER_SITE_OTHER): Payer: 59 | Admitting: Gynecology

## 2014-01-02 ENCOUNTER — Encounter: Payer: Self-pay | Admitting: Gynecology

## 2014-01-02 ENCOUNTER — Other Ambulatory Visit (HOSPITAL_COMMUNITY)
Admission: RE | Admit: 2014-01-02 | Discharge: 2014-01-02 | Disposition: A | Discharge status: Home / Self Care | Payer: 59 | Source: Ambulatory Visit | Attending: Gynecology | Admitting: Gynecology

## 2014-01-02 VITALS — BP 138/88 | Ht 64.0 in | Wt 182.0 lb

## 2014-01-02 DIAGNOSIS — Z8639 Personal history of other endocrine, nutritional and metabolic disease: Secondary | ICD-10-CM

## 2014-01-02 DIAGNOSIS — R635 Abnormal weight gain: Secondary | ICD-10-CM

## 2014-01-02 DIAGNOSIS — Z1151 Encounter for screening for human papillomavirus (HPV): Secondary | ICD-10-CM | POA: Insufficient documentation

## 2014-01-02 DIAGNOSIS — Z01419 Encounter for gynecological examination (general) (routine) without abnormal findings: Secondary | ICD-10-CM

## 2014-01-02 DIAGNOSIS — N898 Other specified noninflammatory disorders of vagina: Secondary | ICD-10-CM

## 2014-01-02 LAB — WET PREP FOR TRICH, YEAST, CLUE: TRICH WET PREP: NONE SEEN

## 2014-01-02 MED ORDER — FLUCONAZOLE 150 MG PO TABS: 150.0000 mg | ORAL_TABLET | Freq: Once | ORAL | Status: DC

## 2014-01-02 MED ORDER — TINIDAZOLE 500 MG PO TABS: ORAL_TABLET | ORAL | Status: DC

## 2014-01-02 NOTE — Patient Instructions (Signed)
Monilial Vaginitis Vaginitis in a soreness, swelling and redness (inflammation) of the vagina and vulva. Monilial vaginitis is not a sexually transmitted infection. CAUSES  Yeast vaginitis is caused by yeast (candida) that is normally found in your vagina. With a yeast infection, the candida has overgrown in number to a point that upsets the chemical balance. SYMPTOMS   White, thick vaginal discharge.  Swelling, itching, redness and irritation of the vagina and possibly the lips of the vagina (vulva).  Burning or painful urination.  Painful intercourse. DIAGNOSIS  Things that may contribute to monilial vaginitis are:  Postmenopausal and virginal states.  Pregnancy.  Infections.  Being tired, sick or stressed, especially if you had monilial vaginitis in the past.  Diabetes. Good control will help lower the chance.  Birth control pills.  Tight fitting garments.  Using bubble bath, feminine sprays, douches or deodorant tampons.  Taking certain medications that kill germs (antibiotics).  Sporadic recurrence can occur if you become ill. TREATMENT  Your caregiver will give you medication.  There are several kinds of anti monilial vaginal creams and suppositories specific for monilial vaginitis. For recurrent yeast infections, use a suppository or cream in the vagina 2 times a week, or as directed.  Anti-monilial or steroid cream for the itching or irritation of the vulva may also be used. Get your caregiver's permission.  Painting the vagina with methylene blue solution may help if the monilial cream does not work.  Eating yogurt may help prevent monilial vaginitis. HOME CARE INSTRUCTIONS   Finish all medication as prescribed.  Do not have sex until treatment is completed or after your caregiver tells you it is okay.  Take warm sitz baths.  Do not douche.  Do not use tampons, especially scented ones.  Wear cotton underwear.  Avoid tight pants and panty  hose.  Tell your sexual partner that you have a yeast infection. They should go to their caregiver if they have symptoms such as mild rash or itching.  Your sexual partner should be treated as well if your infection is difficult to eliminate.  Practice safer sex. Use condoms.  Some vaginal medications cause latex condoms to fail. Vaginal medications that harm condoms are:  Cleocin cream.  Butoconazole (Femstat).  Terconazole (Terazol) vaginal suppository.  Miconazole (Monistat) (may be purchased over the counter). SEEK MEDICAL CARE IF:   You have a temperature by mouth above 102 F (38.9 C).  The infection is getting worse after 2 days of treatment.  The infection is not getting better after 3 days of treatment.  You develop blisters in or around your vagina.  You develop vaginal bleeding, and it is not your menstrual period.  You have pain when you urinate.  You develop intestinal problems.  You have pain with sexual intercourse. Document Released: 03/12/2005 Document Revised: 08/25/2011 Document Reviewed: 11/24/2008 ExitCare Patient Information 2015 [employer]. This information is not intended to replace advice given to you by your health care provider. Make sure you discuss any questions you have with your health care provider. Tinidazole tablets What is this medicine? TINIDAZOLE (tye NI da zole) is an antiinfective. It is used to treat amebiasis, giardiasis, trichomoniasis, and vaginosis. It will not work for colds, flu, or other viral infections. This medicine may be used for other purposes; ask your health care provider or pharmacist if you have questions. COMMON BRAND NAME(S): Tindamax What should I tell my health care provider before I take this medicine? They need to know if you have any   of these conditions: -anemia or other blood disorders -if you frequently drink alcohol containing drinks -receiving hemodialysis -seizure disorder -an unusual or  allergic reaction to tinidazole, other medicines, foods, dyes, or preservatives -pregnant or trying to get pregnant -breast-feeding How should I use this medicine? Take this medicine by mouth with a full glass of water. Follow the directions on the prescription label. Take with food. Take your medicine at regular intervals. Do not take your medicine more often than directed. Take all of your medicine as directed even if you think you are better. Do not skip doses or stop your medicine early. Talk to your pediatrician regarding the use of this medicine in children. While this drug may be prescribed for children as young as 3 years of age for selected conditions, precautions do apply. Overdosage: If you think you have taken too much of this medicine contact a poison control center or emergency room at once. NOTE: This medicine is only for you. Do not share this medicine with others. What if I miss a dose? If you miss a dose, take it as soon as you can. If it is almost time for your next dose, take only that dose. Do not take double or extra doses. What may interact with this medicine? Do not take this medicine with any of the following medications: -alcohol or any product that contains alcohol -amprenavir oral solution -disulfiram -paclitaxel injection -ritonavir oral solution -sertraline oral solution -sulfamethoxazole-trimethoprim injection This medicine may also interact with the following medications: -cholestyramine -cimetidine -conivaptan -cyclosporin -fluorouracil -fosphenytoin, phenytoin -ketoconazole -lithium -phenobarbital -tacrolimus -warfarin This list may not describe all possible interactions. Give your health care provider a list of all the medicines, herbs, non-prescription drugs, or dietary supplements you use. Also tell them if you smoke, drink alcohol, or use illegal drugs. Some items may interact with your medicine. What should I watch for while using this  medicine? Tell your doctor or health care professional if your symptoms do not improve or if they get worse. Avoid alcoholic drinks while you are taking this medicine and for three days afterward. Alcohol may make you feel dizzy, sick, or flushed. If you are being treated for a sexually transmitted disease, avoid sexual contact until you have finished your treatment. Your sexual partner may also need treatment. What side effects may I notice from receiving this medicine? Side effects that you should report to your doctor or health care professional as soon as possible: -allergic reactions like skin rash, itching or hives, swelling of the face, lips, or tongue -breathing problems -confusion, depression -dark or white patches in the mouth -feeling faint or lightheaded, falls -fever, infection -numbness, tingling, pain or weakness in the hands or feet -pain when passing urine -seizures -unusually weak or tired -vaginal irritation or discharge -vomiting Side effects that usually do not require medical attention (report to your doctor or health care professional if they continue or are bothersome): -dark brown or reddish urine -diarrhea -headache -loss of appetite -metallic taste -nausea -stomach upset This list may not describe all possible side effects. Call your doctor for medical advice about side effects. You may report side effects to FDA at 1-800-FDA-1088. Where should I keep my medicine? Keep out of the reach of children. Store at room temperature between 15 and 30 degrees C (59 and 86 degrees F). Protect from light and moisture. Keep container tightly closed. Throw away any unused medicine after the expiration date. NOTE: This sheet is a summary. It may not cover all   possible information. If you have questions about this medicine, talk to your doctor, pharmacist, or health care provider.  2015, Elsevier/Gold Standard. (2008-02-28 15:22:28) Bacterial Vaginosis Bacterial vaginosis  is a vaginal infection that occurs when the normal balance of bacteria in the vagina is disrupted. It results from an overgrowth of certain bacteria. This is the most common vaginal infection in women of childbearing age. Treatment is important to prevent complications, especially in pregnant women, as it can cause a premature delivery. CAUSES  Bacterial vaginosis is caused by an increase in harmful bacteria that are normally present in smaller amounts in the vagina. Several different kinds of bacteria can cause bacterial vaginosis. However, the reason that the condition develops is not fully understood. RISK FACTORS Certain activities or behaviors can put you at an increased risk of developing bacterial vaginosis, including:  Having a new sex partner or multiple sex partners.  Douching.  Using an intrauterine device (IUD) for contraception. Women do not get bacterial vaginosis from toilet seats, bedding, swimming pools, or contact with objects around them. SIGNS AND SYMPTOMS  Some women with bacterial vaginosis have no signs or symptoms. Common symptoms include:  Grey vaginal discharge.  A fishlike odor with discharge, especially after sexual intercourse.  Itching or burning of the vagina and vulva.  Burning or pain with urination. DIAGNOSIS  Your health care provider will take a medical history and examine the vagina for signs of bacterial vaginosis. A sample of vaginal fluid may be taken. Your health care provider will look at this sample under a microscope to check for bacteria and abnormal cells. A vaginal pH test may also be done.  TREATMENT  Bacterial vaginosis may be treated with antibiotic medicines. These may be given in the form of a pill or a vaginal cream. A second round of antibiotics may be prescribed if the condition comes back after treatment.  HOME CARE INSTRUCTIONS   Only take over-the-counter or prescription medicines as directed by your health care provider.  If  antibiotic medicine was prescribed, take it as directed. Make sure you finish it even if you start to feel better.  Do not have sex until treatment is completed.  Tell all sexual partners that you have a vaginal infection. They should see their health care provider and be treated if they have problems, such as a mild rash or itching.  Practice safe sex by using condoms and only having one sex partner. SEEK MEDICAL CARE IF:   Your symptoms are not improving after 3 days of treatment.  You have increased discharge or pain.  You have a fever. MAKE SURE YOU:   Understand these instructions.  Will watch your condition.  Will get help right away if you are not doing well or get worse. FOR MORE INFORMATION  Centers for Disease Control and Prevention, Division of STD Prevention: www.cdc.gov/std American Sexual Health Association (ASHA): www.ashastd.org  Document Released: 06/02/2005 Document Revised: 03/23/2013 Document Reviewed: 01/12/2013 ExitCare Patient Information 2015 [employer]. This information is not intended to replace advice given to you by your health care provider. Make sure you discuss any questions you have with your health care provider.  

## 2014-01-02 NOTE — Progress Notes (Signed)
Kristina LawmanJoanne L Bound Dec 06, 1955 [id number]   History:    58 y.o.  for annual gyn exam with no complaints today. Patient lost her brother and mother this year but she appears to be coping well. The patient has not seen her PCP and would like to have her blood work drawn today with this week when she returns fasting. Patient had a normal bone density study and mammogram in 2014. Patient with no prior history of any abnormal Pap smears in the past. She has been complaining of some weight gain. Patient has had history in the past and vitamin D deficiency. Normal colonoscopy 2011. Patient on no hormone replacement therapy.  Past medical history,surgical history, family history and social history were all reviewed and documented in the EPIC chart.  Gynecologic History Patient's last menstrual period was 10/15/2003. Contraception: post menopausal status Last Pap: 2012. Results were: normal Last mammogram: 2014. Results were: normal  Obstetric History OB History  Gravida Para Term Preterm AB SAB TAB Ectopic Multiple Living  3 3 2       2     # Outcome Date GA Lbr Len/2nd Weight Sex Delivery Anes PTL Lv  3 TRM     M CS  N Y  2 TRM     F SVD  N Y  1 PAR      SVD  N SB       ROS: A ROS was performed and pertinent positives and negatives are included in the history.  GENERAL: No fevers or chills. HEENT: No change in vision, no earache, sore throat or sinus congestion. NECK: No pain or stiffness. CARDIOVASCULAR: No chest pain or pressure. No palpitations. PULMONARY: No shortness of breath, cough or wheeze. GASTROINTESTINAL: No abdominal pain, nausea, vomiting or diarrhea, melena or bright red blood per rectum. GENITOURINARY: No urinary frequency, urgency, hesitancy or dysuria. MUSCULOSKELETAL: No joint or muscle pain, no back pain, no recent trauma. DERMATOLOGIC: No rash, no itching, no lesions. ENDOCRINE: No polyuria, polydipsia, no heat or cold intolerance. No recent change in weight.  HEMATOLOGICAL: No anemia or easy bruising or bleeding. NEUROLOGIC: No headache, seizures, numbness, tingling or weakness. PSYCHIATRIC: No depression, no loss of interest in normal activity or change in sleep pattern.     Exam: chaperone present  BP 138/88  Ht 5\' 4"  (1.626 m)  Wt 182 lb (82.555 kg)  BMI 31.22 kg/m2  LMP 10/15/2003  Body mass index is 31.22 kg/(m^2).  General appearance : Well developed well nourished female. No acute distress HEENT: Neck supple, trachea midline, no carotid bruits, no thyroidmegaly Lungs: Clear to auscultation, no rhonchi or wheezes, or rib retractions  Heart: Regular rate and rhythm, no murmurs or gallops Breast:Examined in sitting and supine position were symmetrical in appearance, no palpable masses or tenderness,  no skin retraction, no nipple inversion, no nipple discharge, no skin discoloration, no axillary or supraclavicular lymphadenopathy Abdomen: no palpable masses or tenderness, no rebound or guarding Extremities: no edema or skin discoloration or tenderness  Pelvic:  Bartholin, Urethra, Skene Glands: Within normal limits             Vagina: Thick white discharge  Cervix: No gross lesions or discharge  Uterus  anteverted, normal size, shape and consistency, non-tender and mobile  Adnexa  Without masses or tenderness  Anus and perineum  normal   Rectovaginal  normal sphincter tone without palpated masses or tenderness             Hemoccult cards were  provided   Wet prep: A few yeast, few clue cells, moderate WBC, too numerous to count bacteria  Assessment/Plan:  58 y.o. female for annual exam with apparent yeast vaginitis and bacterial vaginosis. She will be prescribed Diflucan 150 mg one by mouth today and starting tomorrow for 2 days she will takeTindamax 500 mg 4 tablets each day. She will return later next week for fasting blood work consisting of comprehensive metabolic panel, fasting lipid profile, TSH, CBC and urinalysis. Pap smear  was done today. Patient was reminded to schedule a mammogram at the end of the year. She would need a bone density study until next year. We are checking her vitamin D level as well because of passage of vitamin D deficiency.  Note: This dictation was prepared with  Dragon/digital dictation along withSmart phrase technology. Any transcriptional errors that result from this process are unintentional.   Ok Edwards MD, 4:14 PM 01/02/2014

## 2014-01-03 ENCOUNTER — Other Ambulatory Visit: Payer: 59

## 2014-01-03 DIAGNOSIS — Z8639 Personal history of other endocrine, nutritional and metabolic disease: Secondary | ICD-10-CM

## 2014-01-03 DIAGNOSIS — Z01419 Encounter for gynecological examination (general) (routine) without abnormal findings: Secondary | ICD-10-CM

## 2014-01-03 LAB — URINALYSIS W MICROSCOPIC + REFLEX CULTURE
BILIRUBIN URINE: NEGATIVE
CRYSTALS: NONE SEEN
Casts: NONE SEEN
GLUCOSE, UA: 100 mg/dL — AB
Hgb urine dipstick: NEGATIVE
Ketones, ur: NEGATIVE mg/dL
Nitrite: NEGATIVE
Protein, ur: NEGATIVE mg/dL
SPECIFIC GRAVITY, URINE: 1.022 (ref 1.005–1.030)
Urobilinogen, UA: 1 mg/dL (ref 0.0–1.0)
pH: 7 (ref 5.0–8.0)

## 2014-01-03 LAB — COMPREHENSIVE METABOLIC PANEL
ALBUMIN: 4.1 g/dL (ref 3.5–5.2)
ALT: 25 U/L (ref 0–35)
AST: 19 U/L (ref 0–37)
Alkaline Phosphatase: 83 U/L (ref 39–117)
BUN: 8 mg/dL (ref 6–23)
CALCIUM: 9.2 mg/dL (ref 8.4–10.5)
CHLORIDE: 103 meq/L (ref 96–112)
CO2: 29 meq/L (ref 19–32)
CREATININE: 0.7 mg/dL (ref 0.50–1.10)
GLUCOSE: 177 mg/dL — AB (ref 70–99)
POTASSIUM: 4.5 meq/L (ref 3.5–5.3)
Sodium: 140 mEq/L (ref 135–145)
Total Bilirubin: 0.8 mg/dL (ref 0.2–1.2)
Total Protein: 7 g/dL (ref 6.0–8.3)

## 2014-01-03 LAB — CBC WITH DIFFERENTIAL/PLATELET
BASOS ABS: 0 10*3/uL (ref 0.0–0.1)
Basophils Relative: 0 % (ref 0–1)
Eosinophils Absolute: 0.1 10*3/uL (ref 0.0–0.7)
Eosinophils Relative: 1 % (ref 0–5)
HCT: 38 % (ref 36.0–46.0)
HEMOGLOBIN: 12.7 g/dL (ref 12.0–15.0)
LYMPHS ABS: 2.4 10*3/uL (ref 0.7–4.0)
Lymphocytes Relative: 38 % (ref 12–46)
MCH: 28 pg (ref 26.0–34.0)
MCHC: 33.4 g/dL (ref 30.0–36.0)
MCV: 83.7 fL (ref 78.0–100.0)
MONO ABS: 0.4 10*3/uL (ref 0.1–1.0)
Monocytes Relative: 6 % (ref 3–12)
NEUTROS ABS: 3.4 10*3/uL (ref 1.7–7.7)
Neutrophils Relative %: 55 % (ref 43–77)
Platelets: 287 10*3/uL (ref 150–400)
RBC: 4.54 MIL/uL (ref 3.87–5.11)
RDW: 14.1 % (ref 11.5–15.5)
WBC: 6.2 10*3/uL (ref 4.0–10.5)

## 2014-01-03 LAB — LIPID PANEL
Cholesterol: 181 mg/dL (ref 0–200)
HDL: 52 mg/dL (ref >39)
LDL Cholesterol: 104 mg/dL — ABNORMAL HIGH (ref 0–99)
TRIGLYCERIDES: 124 mg/dL (ref <150)
Total CHOL/HDL Ratio: 3.5 Ratio
VLDL: 25 mg/dL (ref 0–40)

## 2014-01-03 LAB — TSH: TSH: 1.827 u[IU]/mL (ref 0.350–4.500)

## 2014-01-03 LAB — HEMOGLOBIN A1C
Hgb A1c MFr Bld: 8.4 % — ABNORMAL HIGH (ref <5.7)
MEAN PLASMA GLUCOSE: 194 mg/dL — AB (ref <117)

## 2014-01-03 NOTE — Addendum Note (Signed)
Addended by: Ok EdwardsFERNANDEZ, JUAN H on: 01/03/2014 08:58 AM   Modules accepted: Orders

## 2014-01-04 ENCOUNTER — Other Ambulatory Visit: Payer: Self-pay | Admitting: Gynecology

## 2014-01-04 DIAGNOSIS — E559 Vitamin D deficiency, unspecified: Secondary | ICD-10-CM

## 2014-01-04 LAB — VITAMIN D 25 HYDROXY (VIT D DEFICIENCY, FRACTURES): Vit D, 25-Hydroxy: 20 ng/mL — ABNORMAL LOW (ref 30–89)

## 2014-01-04 LAB — CYTOLOGY - PAP

## 2014-01-04 MED ORDER — NITROFURANTOIN MONOHYD MACRO 100 MG PO CAPS: 100.0000 mg | ORAL_CAPSULE | Freq: Two times a day (BID) | ORAL | Status: DC

## 2014-01-04 MED ORDER — VITAMIN D (ERGOCALCIFEROL) 1.25 MG (50000 UNIT) PO CAPS: 50000.0000 [IU] | ORAL_CAPSULE | ORAL | Status: DC

## 2014-01-05 LAB — URINE CULTURE: Colony Count: 40000

## 2014-03-10 LAB — HM DIABETES EYE EXAM

## 2014-04-17 ENCOUNTER — Encounter: Payer: Self-pay | Admitting: Gynecology

## 2014-07-28 ENCOUNTER — Ambulatory Visit
Admission: RE | Admit: 2014-07-28 | Discharge: 2014-07-28 | Disposition: A | Discharge status: Home / Self Care | Payer: 59 | Source: Ambulatory Visit | Attending: Family | Admitting: Family

## 2014-07-28 ENCOUNTER — Other Ambulatory Visit: Payer: Self-pay | Admitting: Family

## 2014-07-28 DIAGNOSIS — M542 Cervicalgia: Secondary | ICD-10-CM

## 2014-07-28 DIAGNOSIS — M546 Pain in thoracic spine: Secondary | ICD-10-CM

## 2015-01-05 ENCOUNTER — Encounter: Payer: Self-pay | Admitting: Gynecology

## 2015-01-05 ENCOUNTER — Ambulatory Visit (INDEPENDENT_AMBULATORY_CARE_PROVIDER_SITE_OTHER): Payer: 59 | Admitting: Gynecology

## 2015-01-05 VITALS — BP 160/80 | Ht 64.0 in | Wt 183.0 lb

## 2015-01-05 DIAGNOSIS — Z780 Asymptomatic menopausal state: Secondary | ICD-10-CM | POA: Diagnosis not present

## 2015-01-05 DIAGNOSIS — E559 Vitamin D deficiency, unspecified: Secondary | ICD-10-CM

## 2015-01-05 DIAGNOSIS — Z01419 Encounter for gynecological examination (general) (routine) without abnormal findings: Secondary | ICD-10-CM
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
## 2015-01-05 NOTE — Addendum Note (Signed)
Addended by: Rushie Goltz on: 01/05/2015 05:35 PM   Modules accepted: Orders

## 2015-01-05 NOTE — Patient Instructions (Signed)

## 2015-01-06 LAB — URINALYSIS W MICROSCOPIC + REFLEX CULTURE
BILIRUBIN URINE: NEGATIVE
CRYSTALS: NONE SEEN
Casts: NONE SEEN
Hgb urine dipstick: NEGATIVE
Ketones, ur: NEGATIVE mg/dL
Leukocytes, UA: NEGATIVE
Nitrite: NEGATIVE
PH: 7 (ref 5.0–8.0)
PROTEIN: NEGATIVE mg/dL
Urobilinogen, UA: 0.2 mg/dL (ref 0.0–1.0)

## 2015-01-07 LAB — URINE CULTURE

## 2015-01-11 ENCOUNTER — Other Ambulatory Visit: Payer: 59

## 2015-01-11 DIAGNOSIS — Z01419 Encounter for gynecological examination (general) (routine) without abnormal findings: Secondary | ICD-10-CM

## 2015-01-11 LAB — CBC WITH DIFFERENTIAL/PLATELET
Basophils Absolute: 0 10*3/uL (ref 0.0–0.1)
Basophils Relative: 0 % (ref 0–1)
Eosinophils Absolute: 0 10*3/uL (ref 0.0–0.7)
Eosinophils Relative: 1 % (ref 0–5)
HEMATOCRIT: 39.4 % (ref 36.0–46.0)
Hemoglobin: 12.9 g/dL (ref 12.0–15.0)
LYMPHS PCT: 38 % (ref 12–46)
Lymphs Abs: 1.8 10*3/uL (ref 0.7–4.0)
MCH: 28 pg (ref 26.0–34.0)
MCHC: 32.7 g/dL (ref 30.0–36.0)
MCV: 85.7 fL (ref 78.0–100.0)
MPV: 10.1 fL (ref 8.6–12.4)
Monocytes Absolute: 0.2 10*3/uL (ref 0.1–1.0)
Monocytes Relative: 5 % (ref 3–12)
Neutro Abs: 2.7 10*3/uL (ref 1.7–7.7)
Neutrophils Relative %: 56 % (ref 43–77)
PLATELETS: 278 10*3/uL (ref 150–400)
RBC: 4.6 MIL/uL (ref 3.87–5.11)
RDW: 13.9 % (ref 11.5–15.5)
WBC: 4.8 10*3/uL (ref 4.0–10.5)

## 2015-01-11 LAB — LIPID PANEL
CHOL/HDL RATIO: 4.1 ratio (ref <=5.0)
Cholesterol: 196 mg/dL (ref 125–200)
HDL: 48 mg/dL (ref >=46)
LDL Cholesterol: 129 mg/dL (ref <130)
TRIGLYCERIDES: 95 mg/dL (ref <150)
VLDL: 19 mg/dL (ref <30)

## 2015-01-11 LAB — COMPREHENSIVE METABOLIC PANEL
ALBUMIN: 3.9 g/dL (ref 3.6–5.1)
ALT: 22 U/L (ref 6–29)
AST: 17 U/L (ref 10–35)
Alkaline Phosphatase: 98 U/L (ref 33–130)
BUN: 7 mg/dL (ref 7–25)
CHLORIDE: 106 meq/L (ref 98–110)
CO2: 26 mEq/L (ref 20–31)
Calcium: 9.1 mg/dL (ref 8.6–10.4)
Creat: 0.69 mg/dL (ref 0.50–1.05)
GLUCOSE: 236 mg/dL — AB (ref 65–99)
POTASSIUM: 4.4 meq/L (ref 3.5–5.3)
SODIUM: 139 meq/L (ref 135–146)
TOTAL PROTEIN: 6.9 g/dL (ref 6.1–8.1)
Total Bilirubin: 0.6 mg/dL (ref 0.2–1.2)

## 2015-01-11 LAB — HEMOGLOBIN A1C
Hgb A1c MFr Bld: 10.7 % — ABNORMAL HIGH (ref <5.7)
MEAN PLASMA GLUCOSE: 260 mg/dL — AB (ref <117)

## 2015-01-11 LAB — TSH: TSH: 1.013 u[IU]/mL (ref 0.350–4.500)

## 2015-01-18 ENCOUNTER — Other Ambulatory Visit: Payer: 59

## 2015-01-18 ENCOUNTER — Ambulatory Visit (INDEPENDENT_AMBULATORY_CARE_PROVIDER_SITE_OTHER): Payer: 59

## 2015-01-18 ENCOUNTER — Other Ambulatory Visit: Payer: Self-pay | Admitting: Gynecology

## 2015-01-18 DIAGNOSIS — E559 Vitamin D deficiency, unspecified: Secondary | ICD-10-CM

## 2015-01-18 DIAGNOSIS — Z13820 Encounter for screening for osteoporosis: Secondary | ICD-10-CM

## 2015-01-18 DIAGNOSIS — Z780 Asymptomatic menopausal state: Secondary | ICD-10-CM | POA: Diagnosis not present

## 2015-01-29 ENCOUNTER — Encounter: Payer: Self-pay | Admitting: Gynecology

## 2015-02-09 DIAGNOSIS — Z1211 Encounter for screening for malignant neoplasm of colon: Secondary | ICD-10-CM

## 2015-02-12 ENCOUNTER — Other Ambulatory Visit: Payer: 59 | Admitting: Anesthesiology

## 2015-02-12 DIAGNOSIS — Z1211 Encounter for screening for malignant neoplasm of colon: Secondary | ICD-10-CM

## 2016-01-07 ENCOUNTER — Encounter: Payer: Self-pay | Admitting: Gynecology

## 2016-01-07 ENCOUNTER — Ambulatory Visit (INDEPENDENT_AMBULATORY_CARE_PROVIDER_SITE_OTHER): Payer: 59 | Admitting: Gynecology

## 2016-01-07 VITALS — BP 138/84 | Ht 64.0 in | Wt 181.0 lb

## 2016-01-07 DIAGNOSIS — Z01419 Encounter for gynecological examination (general) (routine) without abnormal findings: Secondary | ICD-10-CM

## 2016-01-07 DIAGNOSIS — Z780 Asymptomatic menopausal state: Secondary | ICD-10-CM | POA: Diagnosis not present

## 2016-01-07 DIAGNOSIS — Z8639 Personal history of other endocrine, nutritional and metabolic disease: Secondary | ICD-10-CM

## 2016-01-07 NOTE — Patient Instructions (Signed)
Breast Self-Awareness Practicing breast self-awareness may pick up problems early, prevent significant medical complications, and possibly save your life. By practicing breast self-awareness, you can become familiar with how your breasts look and feel and if your breasts are changing. This allows you to notice changes early. It can also offer you some reassurance that your breast health is good. One way to learn what is normal for your breasts and whether your breasts are changing is to do a breast self-exam. If you find a lump or something that was not present in the past, it is best to contact your caregiver right away. Other findings that should be evaluated by your caregiver include nipple discharge, especially if it is bloody; skin changes or reddening; areas where the skin seems to be pulled in (retracted); or new lumps and bumps. Breast pain is seldom associated with cancer (malignancy), but should also be evaluated by a caregiver. HOW TO PERFORM A BREAST SELF-EXAM The best time to examine your breasts is 5-7 days after your menstrual period is over. During menstruation, the breasts are lumpier, and it may be more difficult to pick up changes. If you do not menstruate, have reached menopause, or had your uterus removed (hysterectomy), you should examine your breasts at regular intervals, such as monthly. If you are breastfeeding, examine your breasts after a feeding or after using a breast pump. Breast implants do not decrease the risk for lumps or tumors, so continue to perform breast self-exams as recommended. Talk to your caregiver about how to determine the difference between the implant and breast tissue. Also, talk about the amount of pressure you should use during the exam. Over time, you will become more familiar with the variations of your breasts and more comfortable with the exam. A breast self-exam requires you to remove all your clothes above the waist. 1. Look at your breasts and nipples.  Stand in front of a mirror in a room with good lighting. With your hands on your hips, push your hands firmly downward. Look for a difference in shape, contour, and size from one breast to the other (asymmetry). Asymmetry includes puckers, dips, or bumps. Also, look for skin changes, such as reddened or scaly areas on the breasts. Look for nipple changes, such as discharge, dimpling, repositioning, or redness. 2. Carefully feel your breasts. This is best done either in the shower or tub while using soapy water or when flat on your back. Place the arm (on the side of the breast you are examining) above your head. Use the pads (not the fingertips) of your three middle fingers on your opposite hand to feel your breasts. Start in the underarm area and use  inch (2 cm) overlapping circles to feel your breast. Use 3 different levels of pressure (light, medium, and firm pressure) at each circle before moving to the next circle. The light pressure is needed to feel the tissue closest to the skin. The medium pressure will help to feel breast tissue a little deeper, while the firm pressure is needed to feel the tissue close to the ribs. Continue the overlapping circles, moving downward over the breast until you feel your ribs below your breast. Then, move one finger-width towards the center of the body. Continue to use the  inch (2 cm) overlapping circles to feel your breast as you move slowly up toward the collar bone (clavicle) near the base of the neck. Continue the up and down exam using all 3 pressures until you reach the   middle of the chest. Do this with each breast, carefully feeling for lumps or changes. 3.  Keep a written record with breast changes or normal findings for each breast. By writing this information down, you do not need to depend only on memory for size, tenderness, or location. Write down where you are in your menstrual cycle, if you are still menstruating. Breast tissue can have some lumps or  thick tissue. However, see your caregiver if you find anything that concerns you.  SEEK MEDICAL CARE IF:  You see a change in shape, contour, or size of your breasts or nipples.   You see skin changes, such as reddened or scaly areas on the breasts or nipples.   You have an unusual discharge from your nipples.   You feel a new lump or unusually thick areas.    This information is not intended to replace advice given to you by your health care provider. Make sure you discuss any questions you have with your health care provider.   Document Released: 06/02/2005 Document Revised: 05/19/2012 Document Reviewed: 09/17/2011 Elsevier Interactive Patient Education 2016 [employer] Inc[employer]  
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
## 2016-01-08 ENCOUNTER — Encounter: Payer: Self-pay | Admitting: Gynecology

## 2016-01-08 LAB — VITAMIN D 25 HYDROXY (VIT D DEFICIENCY, FRACTURES): Vit D, 25-Hydroxy: 14 ng/mL — ABNORMAL LOW (ref 30–100)

## 2016-01-11 ENCOUNTER — Other Ambulatory Visit: Payer: Self-pay | Admitting: Gynecology

## 2016-01-11 DIAGNOSIS — E559 Vitamin D deficiency, unspecified: Secondary | ICD-10-CM

## 2016-01-11 MED ORDER — VITAMIN D (ERGOCALCIFEROL) 1.25 MG (50000 UNIT) PO CAPS: 50000.0000 [IU] | ORAL_CAPSULE | ORAL | 0 refills | Status: DC

## 2016-01-15 ENCOUNTER — Other Ambulatory Visit: Payer: Self-pay | Admitting: Anesthesiology

## 2016-01-15 DIAGNOSIS — Z1211 Encounter for screening for malignant neoplasm of colon: Secondary | ICD-10-CM

## 2016-05-12 ENCOUNTER — Other Ambulatory Visit: Payer: 59

## 2016-05-12 DIAGNOSIS — E559 Vitamin D deficiency, unspecified: Secondary | ICD-10-CM

## 2016-05-13 LAB — VITAMIN D 25 HYDROXY (VIT D DEFICIENCY, FRACTURES): Vit D, 25-Hydroxy: 24 ng/mL — ABNORMAL LOW (ref 30–100)

## 2016-05-15 ENCOUNTER — Other Ambulatory Visit: Payer: Self-pay | Admitting: Gynecology

## 2016-05-15 DIAGNOSIS — E559 Vitamin D deficiency, unspecified: Secondary | ICD-10-CM

## 2016-05-15 MED ORDER — VITAMIN D (ERGOCALCIFEROL) 1.25 MG (50000 UNIT) PO CAPS: 50000.0000 [IU] | ORAL_CAPSULE | ORAL | 0 refills | Status: DC
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
## 2016-10-29 ENCOUNTER — Encounter: Payer: Self-pay | Admitting: Gynecology

## 2016-12-27 ENCOUNTER — Encounter: Payer: Self-pay | Admitting: Gynecology

## 2016-12-27 LAB — HM DIABETES EYE EXAM

## 2017-01-09 ENCOUNTER — Ambulatory Visit (INDEPENDENT_AMBULATORY_CARE_PROVIDER_SITE_OTHER): Payer: 59 | Admitting: Gynecology

## 2017-01-09 ENCOUNTER — Encounter: Payer: Self-pay | Admitting: Gynecology

## 2017-01-09 VITALS — BP 168/80 | Ht 64.0 in | Wt 179.0 lb

## 2017-01-09 DIAGNOSIS — Z780 Asymptomatic menopausal state: Secondary | ICD-10-CM | POA: Diagnosis not present

## 2017-01-09 DIAGNOSIS — Z8639 Personal history of other endocrine, nutritional and metabolic disease: Secondary | ICD-10-CM

## 2017-01-09 DIAGNOSIS — Z01419 Encounter for gynecological examination (general) (routine) without abnormal findings: Secondary | ICD-10-CM

## 2017-01-09 NOTE — Addendum Note (Signed)
Addended by: Rushie GoltzSPANGLER, Filiberto Wamble on: 01/09/2017 04:45 PM   Modules accepted: Orders

## 2017-01-09 NOTE — Patient Instructions (Signed)
Bone Densitometry Bone densitometry is an imaging test that uses a special X-ray to measure the amount of calcium and other minerals in your bones (bone density). This test is also known as a bone mineral density test or dual-energy X-ray absorptiometry (DXA). The test can measure bone density at your hip and your spine. It is similar to having a regular X-ray. You may have this test to:  Diagnose a condition that causes weak or thin bones (osteoporosis).  Predict your risk of a broken bone (fracture).  Determine how well osteoporosis treatment is working.  Tell a health care provider about:  Any allergies you have.  All medicines you are taking, including vitamins, herbs, eye drops, creams, and over-the-counter medicines.  Any problems you or family members have had with anesthetic medicines.  Any blood disorders you have.  Any surgeries you have had.  Any medical conditions you have.  Possibility of pregnancy.  Any other medical test you had within the previous 14 days that used contrast material. What are the risks? Generally, this is a safe procedure. However, problems can occur and may include the following:  This test exposes you to a very small amount of radiation.  The risks of radiation exposure may be greater to unborn children.  What happens before the procedure?  Do not take any calcium supplements for 24 hours before having the test. You can otherwise eat and drink what you usually do.  Take off all metal jewelry, eyeglasses, dental appliances, and any other metal objects. What happens during the procedure?  You may lie on an exam table. There will be an X-ray generator below you and an imaging device above you.  Other devices, such as boxes or braces, may be used to position your body properly for the scan.  You will need to lie still while the machine slowly scans your body.  The images will show up on a computer monitor. What happens after the  procedure? You may need more testing at a later time. This information is not intended to replace advice given to you by your health care provider. Make sure you discuss any questions you have with your health care provider. Document Released: 06/24/2004 Document Revised: 11/08/2015 Document Reviewed: 11/10/2013 Elsevier Interactive Patient Education  2018 Elsevier Inc.   
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
## 2017-01-13 LAB — PAP IG W/ RFLX HPV ASCU

## 2017-01-14 ENCOUNTER — Ambulatory Visit: Payer: 59

## 2017-01-14 ENCOUNTER — Other Ambulatory Visit: Payer: 59

## 2017-01-14 ENCOUNTER — Encounter: Payer: Self-pay | Admitting: Gynecology

## 2017-01-14 NOTE — Addendum Note (Signed)
Addended by: Lear NgMARTIN, MISTY L on: 01/14/2017 09:29 AM   Modules accepted: Orders

## 2017-01-16 ENCOUNTER — Telehealth: Payer: Self-pay | Admitting: Gynecology

## 2017-01-16 NOTE — Telephone Encounter (Signed)
Tell patient I received a copy of her lab results that Dr. Lily PeerFernandez ordered. Her glucose is 303 which is diabetic range, poorly controlled. It is essential that she follows up with her primary physician for evaluation and ongoing management of her diabetes.  Uncontrolled diabetes will lead to renal damage, blindness and other major complications. Her vitamin D level was also low. Recommend taking 2000 units of vitamin D OTC daily and recheck her vitamin D level in 3-6 months.

## 2017-01-19 NOTE — Telephone Encounter (Signed)
Left message for pt to call.

## 2017-01-22 NOTE — Telephone Encounter (Signed)
Pt informed

## 2017-01-27 ENCOUNTER — Other Ambulatory Visit: Payer: Self-pay | Admitting: Gynecology

## 2017-01-27 DIAGNOSIS — Z8639 Personal history of other endocrine, nutritional and metabolic disease: Secondary | ICD-10-CM

## 2017-01-27 DIAGNOSIS — Z13820 Encounter for screening for osteoporosis: Secondary | ICD-10-CM

## 2017-01-30 ENCOUNTER — Telehealth: Payer: Self-pay | Admitting: *Deleted

## 2017-01-30 DIAGNOSIS — E559 Vitamin D deficiency, unspecified: Secondary | ICD-10-CM

## 2017-01-30 MED ORDER — VITAMIN D (ERGOCALCIFEROL) 1.25 MG (50000 UNIT) PO CAPS: ORAL_CAPSULE | ORAL | 0 refills | Status: DC

## 2017-01-30 NOTE — Telephone Encounter (Signed)
Pt informed, Rx sent, order placed.

## 2017-01-30 NOTE — Telephone Encounter (Signed)
Okay for vitamin D 50,000 unit 1 pill weekly #12 check vitamin D level at the end.

## 2017-01-30 NOTE — Telephone Encounter (Signed)
Pt was informed her vitamin d was low and to start taking 2,000units of vitam d OTC. Pt called back and said she is taking a combination pill with vitamin d in it. (which doesn't have 2,000 units) asked if a Rx 50,000 units vitamin d can be prescribed? States it cheaper for her to get a Rx than purchase OTC medication.  Has taken Rx in the past. Please advise

## 2017-02-02 DIAGNOSIS — Z1211 Encounter for screening for malignant neoplasm of colon: Secondary | ICD-10-CM | POA: Diagnosis not present

## 2017-02-03 ENCOUNTER — Other Ambulatory Visit: Payer: Self-pay | Admitting: Anesthesiology

## 2017-02-03 ENCOUNTER — Encounter: Payer: Self-pay | Admitting: Gynecology

## 2017-02-03 ENCOUNTER — Ambulatory Visit (INDEPENDENT_AMBULATORY_CARE_PROVIDER_SITE_OTHER): Payer: 59

## 2017-02-03 DIAGNOSIS — Z8639 Personal history of other endocrine, nutritional and metabolic disease: Secondary | ICD-10-CM | POA: Diagnosis not present

## 2017-02-03 DIAGNOSIS — Z13820 Encounter for screening for osteoporosis: Secondary | ICD-10-CM

## 2017-02-03 DIAGNOSIS — Z1211 Encounter for screening for malignant neoplasm of colon: Secondary | ICD-10-CM

## 2018-01-25 ENCOUNTER — Encounter: Payer: Self-pay | Admitting: Women's Health

## 2018-01-25 ENCOUNTER — Ambulatory Visit: Payer: 59 | Admitting: Women's Health

## 2018-01-25 VITALS — BP 130/84 | Ht 64.0 in | Wt 172.0 lb

## 2018-01-25 DIAGNOSIS — Z01419 Encounter for gynecological examination (general) (routine) without abnormal findings: Secondary | ICD-10-CM

## 2018-01-25 DIAGNOSIS — E559 Vitamin D deficiency, unspecified: Secondary | ICD-10-CM | POA: Diagnosis not present

## 2018-01-25 DIAGNOSIS — R7309 Other abnormal glucose: Secondary | ICD-10-CM

## 2018-01-25 DIAGNOSIS — Z13220 Encounter for screening for lipoid disorders: Secondary | ICD-10-CM | POA: Diagnosis not present

## 2018-01-25 NOTE — Patient Instructions (Addendum)
OTC vit D 2000 daily  Health Maintenance for Postmenopausal Women Menopause is a normal process in which your reproductive ability comes to an end. This process happens gradually over a span of months to years, usually between the ages of 84 and 8. Menopause is complete when you have missed 12 consecutive menstrual periods. It is important to talk with your health care provider about some of the most common conditions that affect postmenopausal women, such as heart disease, cancer, and bone loss (osteoporosis). Adopting a healthy lifestyle and getting preventive care can help to promote your health and wellness. Those actions can also lower your chances of developing some of these common conditions. What should I know about menopause? During menopause, you may experience a number of symptoms, such as:  Moderate-to-severe hot flashes.  Night sweats.  Decrease in sex drive.  Mood swings.  Headaches.  Tiredness.  Irritability.  Memory problems.  Insomnia.  Choosing to treat or not to treat menopausal changes is an individual decision that you make with your health care provider. What should I know about hormone replacement therapy and supplements? Hormone therapy products are effective for treating symptoms that are associated with menopause, such as hot flashes and night sweats. Hormone replacement carries certain risks, especially as you become older. If you are thinking about using estrogen or estrogen with progestin treatments, discuss the benefits and risks with your health care provider. What should I know about heart disease and stroke? Heart disease, heart attack, and stroke become more likely as you age. This may be due, in part, to the hormonal changes that your body experiences during menopause. These can affect how your body processes dietary fats, triglycerides, and cholesterol. Heart attack and stroke are both medical emergencies. There are many things that you can do to  help prevent heart disease and stroke:  Have your blood pressure checked at least every 1-2 years. High blood pressure causes heart disease and increases the risk of stroke.  If you are 6-33 years old, ask your health care provider if you should take aspirin to prevent a heart attack or a stroke.  Do not use any tobacco products, including cigarettes, chewing tobacco, or electronic cigarettes. If you need help quitting, ask your health care provider.  It is important to eat a healthy diet and maintain a healthy weight. ? Be sure to include plenty of vegetables, fruits, low-fat dairy products, and lean protein. ? Avoid eating foods that are high in solid fats, added sugars, or salt (sodium).  Get regular exercise. This is one of the most important things that you can do for your health. ? Try to exercise for at least 150 minutes each week. The type of exercise that you do should increase your heart rate and make you sweat. This is known as moderate-intensity exercise. ? Try to do strengthening exercises at least twice each week. Do these in addition to the moderate-intensity exercise.  Know your numbers.Ask your health care provider to check your cholesterol and your blood glucose. Continue to have your blood tested as directed by your health care provider.  What should I know about cancer screening? There are several types of cancer. Take the following steps to reduce your risk and to catch any cancer development as early as possible. Breast Cancer  Practice breast self-awareness. ? This means understanding how your breasts normally appear and feel. ? It also means doing regular breast self-exams. Let your health care provider know about any changes, no matter how  small.  If you are 40 or older, have a clinician do a breast exam (clinical breast exam or CBE) every year. Depending on your age, family history, and medical history, it may be recommended that you also have a yearly breast  X-ray (mammogram).  If you have a family history of breast cancer, talk with your health care provider about genetic screening.  If you are at high risk for breast cancer, talk with your health care provider about having an MRI and a mammogram every year.  Breast cancer (BRCA) gene test is recommended for women who have family members with BRCA-related cancers. Results of the assessment will determine the need for genetic counseling and BRCA1 and for BRCA2 testing. BRCA-related cancers include these types: ? Breast. This occurs in males or females. ? Ovarian. ? Tubal. This may also be called fallopian tube cancer. ? Cancer of the abdominal or pelvic lining (peritoneal cancer). ? Prostate. ? Pancreatic.  Cervical, Uterine, and Ovarian Cancer Your health care provider may recommend that you be screened regularly for cancer of the pelvic organs. These include your ovaries, uterus, and vagina. This screening involves a pelvic exam, which includes checking for microscopic changes to the surface of your cervix (Pap test).  For women ages 21-65, health care providers may recommend a pelvic exam and a Pap test every three years. For women ages 60-65, they may recommend the Pap test and pelvic exam, combined with testing for human papilloma virus (HPV), every five years. Some types of HPV increase your risk of cervical cancer. Testing for HPV may also be done on women of any age who have unclear Pap test results.  Other health care providers may not recommend any screening for nonpregnant women who are considered low risk for pelvic cancer and have no symptoms. Ask your health care provider if a screening pelvic exam is right for you.  If you have had past treatment for cervical cancer or a condition that could lead to cancer, you need Pap tests and screening for cancer for at least 20 years after your treatment. If Pap tests have been discontinued for you, your risk factors (such as having a new sexual  partner) need to be reassessed to determine if you should start having screenings again. Some women have medical problems that increase the chance of getting cervical cancer. In these cases, your health care provider may recommend that you have screening and Pap tests more often.  If you have a family history of uterine cancer or ovarian cancer, talk with your health care provider about genetic screening.  If you have vaginal bleeding after reaching menopause, tell your health care provider.  There are currently no reliable tests available to screen for ovarian cancer.  Lung Cancer Lung cancer screening is recommended for adults 98-86 years old who are at high risk for lung cancer because of a history of smoking. A yearly low-dose CT scan of the lungs is recommended if you:  Currently smoke.  Have a history of at least 30 pack-years of smoking and you currently smoke or have quit within the past 15 years. A pack-year is smoking an average of one pack of cigarettes per day for one year.  Yearly screening should:  Continue until it has been 15 years since you quit.  Stop if you develop a health problem that would prevent you from having lung cancer treatment.  Colorectal Cancer  This type of cancer can be detected and can often be prevented.  Routine  colorectal cancer screening usually begins at age 24 and continues through age 68.  If you have risk factors for colon cancer, your health care provider may recommend that you be screened at an earlier age.  If you have a family history of colorectal cancer, talk with your health care provider about genetic screening.  Your health care provider may also recommend using home test kits to check for hidden blood in your stool.  A small camera at the end of a tube can be used to examine your colon directly (sigmoidoscopy or colonoscopy). This is done to check for the earliest forms of colorectal cancer.  Direct examination of the colon  should be repeated every 5-10 years until age 41. However, if early forms of precancerous polyps or small growths are found or if you have a family history or genetic risk for colorectal cancer, you may need to be screened more often.  Skin Cancer  Check your skin from head to toe regularly.  Monitor any moles. Be sure to tell your health care provider: ? About any new moles or changes in moles, especially if there is a change in a mole's shape or color. ? If you have a mole that is larger than the size of a pencil eraser.  If any of your family members has a history of skin cancer, especially at a Zolton Dowson age, talk with your health care provider about genetic screening.  Always use sunscreen. Apply sunscreen liberally and repeatedly throughout the day.  Whenever you are outside, protect yourself by wearing long sleeves, pants, a wide-brimmed hat, and sunglasses.  What should I know about osteoporosis? Osteoporosis is a condition in which bone destruction happens more quickly than new bone creation. After menopause, you may be at an increased risk for osteoporosis. To help prevent osteoporosis or the bone fractures that can happen because of osteoporosis, the following is recommended:  If you are 38-12 years old, get at least 1,000 mg of calcium and at least 600 mg of vitamin D per day.  If you are older than age 51 but younger than age 46, get at least 1,200 mg of calcium and at least 600 mg of vitamin D per day.  If you are older than age 43, get at least 1,200 mg of calcium and at least 800 mg of vitamin D per day.  Smoking and excessive alcohol intake increase the risk of osteoporosis. Eat foods that are rich in calcium and vitamin D, and do weight-bearing exercises several times each week as directed by your health care provider. What should I know about how menopause affects my mental health? Depression may occur at any age, but it is more common as you become older. Common symptoms of  depression include:  Low or sad mood.  Changes in sleep patterns.  Changes in appetite or eating patterns.  Feeling an overall lack of motivation or enjoyment of activities that you previously enjoyed.  Frequent crying spells.  Talk with your health care provider if you think that you are experiencing depression. What should I know about immunizations? It is important that you get and maintain your immunizations. These include:  Tetanus, diphtheria, and pertussis (Tdap) booster vaccine.  Influenza every year before the flu season begins.  Pneumonia vaccine.  Shingles vaccine.  Your health care provider may also recommend other immunizations. This information is not intended to replace advice given to you by your health care provider. Make sure you discuss any questions you have with your  health care provider. Document Released: 07/25/2005 Document Revised: 12/21/2015 Document Reviewed: 03/06/2015 Elsevier Interactive Patient Education  2018 [employer].  Carbohydrate Counting for Diabetes Mellitus, Adult Carbohydrate counting is a method for keeping track of how many carbohydrates you eat. Eating carbohydrates naturally increases the amount of sugar (glucose) in the blood. Counting how many carbohydrates you eat helps keep your blood glucose within normal limits, which helps you manage your diabetes (diabetes mellitus). It is important to know how many carbohydrates you can safely have in each meal. This is different for every person. A diet and nutrition specialist (registered dietitian) can help you make a meal plan and calculate how many carbohydrates you should have at each meal and snack. Carbohydrates are found in the following foods:  Grains, such as breads and cereals.  Dried beans and soy products.  Starchy vegetables, such as potatoes, peas, and corn.  Fruit and fruit juices.  Milk and yogurt.  Sweets and snack foods, such as cake, cookies, candy, chips, and  soft drinks.  How do I count carbohydrates? There are two ways to count carbohydrates in food. You can use either of the methods or a combination of both. Reading "Nutrition Facts" on packaged food The "Nutrition Facts" list is included on the labels of almost all packaged foods and beverages in the [place] It includes:  The serving size.  Information about nutrients in each serving, including the grams (g) of carbohydrate per serving.  To use the "Nutrition Facts":  Decide how many servings you will have.  Multiply the number of servings by the number of carbohydrates per serving.  The resulting number is the total amount of carbohydrates that you will be having.  Learning standard serving sizes of other foods When you eat foods containing carbohydrates that are not packaged or do not include "Nutrition Facts" on the label, you need to measure the servings in order to count the amount of carbohydrates:  Measure the foods that you will eat with a food scale or measuring cup, if needed.  Decide how many standard-size servings you will eat.  Multiply the number of servings by 15. Most carbohydrate-rich foods have about 15 g of carbohydrates per serving. ? For example, if you eat 8 oz (170 g) of strawberries, you will have eaten 2 servings and 30 g of carbohydrates (2 servings x 15 g = 30 g).  For foods that have more than one food mixed, such as soups and casseroles, you must count the carbohydrates in each food that is included.  The following list contains standard serving sizes of common carbohydrate-rich foods. Each of these servings has about 15 g of carbohydrates:   hamburger bun or  English muffin.   oz (15 mL) syrup.   oz (14 g) jelly.  1 slice of bread.  1 six-inch tortilla.  3 oz (85 g) cooked rice or pasta.  4 oz (113 g) cooked dried beans.  4 oz (113 g) starchy vegetable, such as peas, corn, or potatoes.  4 oz (113 g) hot cereal.  4 oz (113 g) mashed  potatoes or  of a large baked potato.  4 oz (113 g) canned or frozen fruit.  4 oz (120 mL) fruit juice.  4-6 crackers.  6 chicken nuggets.  6 oz (170 g) unsweetened dry cereal.  6 oz (170 g) plain fat-free yogurt or yogurt sweetened with artificial sweeteners.  8 oz (240 mL) milk.  8 oz (170 g) fresh fruit or one small piece of  fruit.  24 oz (680 g) popped popcorn.  Example of carbohydrate counting Sample meal  3 oz (85 g) chicken breast.  6 oz (170 g) brown rice.  4 oz (113 g) corn.  8 oz (240 mL) milk.  8 oz (170 g) strawberries with sugar-free whipped topping. Carbohydrate calculation 1. Identify the foods that contain carbohydrates: ? Rice. ? Corn. ? Milk. ? Strawberries. 2. Calculate how many servings you have of each food: ? 2 servings rice. ? 1 serving corn. ? 1 serving milk. ? 1 serving strawberries. 3. Multiply each number of servings by 15 g: ? 2 servings rice x 15 g = 30 g. ? 1 serving corn x 15 g = 15 g. ? 1 serving milk x 15 g = 15 g. ? 1 serving strawberries x 15 g = 15 g. 4. Add together all of the amounts to find the total grams of carbohydrates eaten: ? 30 g + 15 g + 15 g + 15 g = 75 g of carbohydrates total. This information is not intended to replace advice given to you by your health care provider. Make sure you discuss any questions you have with your health care provider. Document Released: 06/02/2005 Document Revised: 12/21/2015 Document Reviewed: 11/14/2015 Elsevier Interactive Patient Education  [employer].

## 2018-01-25 NOTE — Progress Notes (Signed)
Rande LawmanJoanne L Hurlbutt [birth year] [id number]    History:    Presents for annual exam.  Postmenopausal/no HRT with no bleeding.  Normal Pap and mammogram history.  2018 normal DEXA T score -0.9 hip average.  2011- colonoscopy.  Type 2 diabetes on no meds diet and exercise.  Not sexually active greater than 20 years.  History of ablation for menorrhagia.  History of vitamin D deficiency.  Past medical history, past surgical history, family history and social history were all reviewed and documented in the EPIC chart.  Production designer, theatre/television/filmManager at [employer].  Daughter [id number], son [id number] ,  grandson  [id number] who is doing well.  [place]  ROS:  A ROS was performed and pertinent positives and negatives are included.  Exam:  Vitals:   01/25/18 1208  BP: 130/84  Weight: 172 lb (78 kg)  Height: 5\' 4"  (1.626 m)   Body mass index is 29.52 kg/m.   General appearance:  Normal Thyroid:  Symmetrical, normal in size, without palpable masses or nodularity. Respiratory  Auscultation:  Clear without wheezing or rhonchi Cardiovascular  Auscultation:  Regular rate, without rubs, murmurs or gallops  Edema/varicosities:  Not grossly evident Abdominal  Soft,nontender, without masses, guarding or rebound.  Liver/spleen:  No organomegaly noted  Hernia:  None appreciated  Skin  Inspection:  Grossly normal   Breasts: Examined lying and sitting.     Right: Without masses, retractions, discharge or axillary adenopathy.     Left: Without masses, retractions, discharge or axillary adenopathy. Gentitourinary   Inguinal/mons:  Normal without inguinal adenopathy  External genitalia:  Normal  BUS/Urethra/Skene's glands:  Normal  Vagina:  Normal  Cervix:  Normal  Uterus:   normal in size, shape and contour.  Midline and mobile  Adnexa/parametria:     Rt: Without masses or tenderness.   Lt: Without masses or tenderness.  Anus and perineum: Normal  Digital rectal exam: Normal sphincter tone without palpated masses  or tenderness  Assessment/Plan:  62 y.o. SBF G3, P2 for annual exam with no complaints.  Postmenopausal/no HRT/no bleeding Normal DEXA Diabetes diet and exercise controlled  Plan: SBE's, continue annual screening mammogram, calcium rich foods, vitamin D 2000 daily encouraged.  Home safety, fall prevention and importance of weightbearing exercise reviewed.  Shingrex recommended at age 60.  Condoms encouraged if sexually active.  CBC, lipid panel, hemoglobin A1c, CMP, vitamin D, Pap normal 2018, new screening guidelines reviewed.   Harrington Challengerancy J Makynleigh Breslin [facility] LtdWHNP, 12:55 PM 01/25/2018

## 2018-01-26 LAB — CBC WITH DIFFERENTIAL/PLATELET
BASOS: 0 %
Basophils Absolute: 0 10*3/uL (ref 0.0–0.2)
EOS (ABSOLUTE): 0 10*3/uL (ref 0.0–0.4)
EOS: 1 %
HEMATOCRIT: 42.4 % (ref 34.0–46.6)
Hemoglobin: 13.5 g/dL (ref 11.1–15.9)
Immature Grans (Abs): 0 10*3/uL (ref 0.0–0.1)
Immature Granulocytes: 0 %
Lymphocytes Absolute: 2.9 10*3/uL (ref 0.7–3.1)
Lymphs: 42 %
MCH: 28.4 pg (ref 26.6–33.0)
MCHC: 31.8 g/dL (ref 31.5–35.7)
MCV: 89 fL (ref 79–97)
MONOS ABS: 0.3 10*3/uL (ref 0.1–0.9)
Monocytes: 5 %
NEUTROS PCT: 52 %
Neutrophils Absolute: 3.6 10*3/uL (ref 1.4–7.0)
Platelets: 318 10*3/uL (ref 150–450)
RBC: 4.76 x10E6/uL (ref 3.77–5.28)
RDW: 14 % (ref 12.3–15.4)
WBC: 6.9 10*3/uL (ref 3.4–10.8)

## 2018-01-26 LAB — LIPID PANEL
CHOL/HDL RATIO: 3.6 ratio (ref 0.0–4.4)
Cholesterol, Total: 218 mg/dL — ABNORMAL HIGH (ref 100–199)
HDL: 60 mg/dL (ref >39)
LDL Calculated: 137 mg/dL — ABNORMAL HIGH (ref 0–99)
Triglycerides: 104 mg/dL (ref 0–149)
VLDL Cholesterol Cal: 21 mg/dL (ref 5–40)

## 2018-01-26 LAB — COMPREHENSIVE METABOLIC PANEL
ALK PHOS: 106 IU/L (ref 39–117)
ALT: 28 IU/L (ref 0–32)
AST: 20 IU/L (ref 0–40)
Albumin/Globulin Ratio: 1.3 (ref 1.2–2.2)
Albumin: 4.3 g/dL (ref 3.5–5.5)
BUN/Creatinine Ratio: 11 (ref 9–23)
BUN: 8 mg/dL (ref 6–24)
Bilirubin Total: 0.5 mg/dL (ref 0.0–1.2)
CALCIUM: 9.7 mg/dL (ref 8.7–10.2)
CO2: 23 mmol/L (ref 20–29)
CREATININE: 0.72 mg/dL (ref 0.57–1.00)
Chloride: 102 mmol/L (ref 96–106)
GFR, EST AFRICAN AMERICAN: 106 mL/min/{1.73_m2} (ref >59)
GFR, EST NON AFRICAN AMERICAN: 92 mL/min/{1.73_m2} (ref >59)
GLOBULIN, TOTAL: 3.3 g/dL (ref 1.5–4.5)
Glucose: 258 mg/dL — ABNORMAL HIGH (ref 65–99)
Potassium: 4.6 mmol/L (ref 3.5–5.2)
SODIUM: 138 mmol/L (ref 134–144)
Total Protein: 7.6 g/dL (ref 6.0–8.5)

## 2018-01-26 LAB — HEMOGLOBIN A1C
ESTIMATED AVERAGE GLUCOSE: 283 mg/dL
Hgb A1c MFr Bld: 11.5 % — ABNORMAL HIGH (ref 4.8–5.6)

## 2018-01-26 LAB — VITAMIN D 25 HYDROXY (VIT D DEFICIENCY, FRACTURES): VIT D 25 HYDROXY: 13.9 ng/mL — AB (ref 30.0–100.0)

## 2018-01-27 ENCOUNTER — Other Ambulatory Visit: Payer: Self-pay | Admitting: Women's Health

## 2018-01-27 MED ORDER — VITAMIN D (ERGOCALCIFEROL) 1.25 MG (50000 UNIT) PO CAPS: 50000.0000 [IU] | ORAL_CAPSULE | ORAL | 0 refills | Status: DC

## 2018-02-09 ENCOUNTER — Encounter: Payer: Self-pay | Admitting: Women's Health

## 2019-02-02 ENCOUNTER — Encounter: Payer: Self-pay | Admitting: Women's Health

## 2019-02-02 ENCOUNTER — Other Ambulatory Visit: Payer: Self-pay

## 2019-02-02 ENCOUNTER — Ambulatory Visit (INDEPENDENT_AMBULATORY_CARE_PROVIDER_SITE_OTHER): Payer: 59 | Admitting: Women's Health

## 2019-02-02 VITALS — BP 132/80 | Ht 64.0 in | Wt 176.0 lb

## 2019-02-02 DIAGNOSIS — R7309 Other abnormal glucose: Secondary | ICD-10-CM

## 2019-02-02 DIAGNOSIS — Z01419 Encounter for gynecological examination (general) (routine) without abnormal findings: Secondary | ICD-10-CM

## 2019-02-02 NOTE — Patient Instructions (Addendum)
Vit D 3 2000 iu daily  Carbohydrate Counting for Diabetes Mellitus, Adult  Carbohydrate counting is a method of keeping track of how many carbohydrates you eat. Eating carbohydrates naturally increases the amount of sugar (glucose) in the blood. Counting how many carbohydrates you eat helps keep your blood glucose within normal limits, which helps you manage your diabetes (diabetes mellitus). It is important to know how many carbohydrates you can safely have in each meal. This is different for every person. A diet and nutrition specialist (registered dietitian) can help you make a meal plan and calculate how many carbohydrates you should have at each meal and snack. Carbohydrates are found in the following foods:  Grains, such as breads and cereals.  Dried beans and soy products.  Starchy vegetables, such as potatoes, peas, and corn.  Fruit and fruit juices.  Milk and yogurt.  Sweets and snack foods, such as cake, cookies, candy, chips, and soft drinks. How do I count carbohydrates? There are two ways to count carbohydrates in food. You can use either of the methods or a combination of both. Reading "Nutrition Facts" on packaged food The "Nutrition Facts" list is included on the labels of almost all packaged foods and beverages in the [place] It includes:  The serving size.  Information about nutrients in each serving, including the grams (g) of carbohydrate per serving. To use the "Nutrition Facts":  Decide how many servings you will have.  Multiply the number of servings by the number of carbohydrates per serving.  The resulting number is the total amount of carbohydrates that you will be having. Learning standard serving sizes of other foods When you eat carbohydrate foods that are not packaged or do not include "Nutrition Facts" on the label, you need to measure the servings in order to count the amount of carbohydrates:  Measure the foods that you will eat with a food scale or  measuring cup, if needed.  Decide how many standard-size servings you will eat.  Multiply the number of servings by 15. Most carbohydrate-rich foods have about 15 g of carbohydrates per serving. ? For example, if you eat 8 oz (170 g) of strawberries, you will have eaten 2 servings and 30 g of carbohydrates (2 servings x 15 g = 30 g).  For foods that have more than one food mixed, such as soups and casseroles, you must count the carbohydrates in each food that is included. The following list contains standard serving sizes of common carbohydrate-rich foods. Each of these servings has about 15 g of carbohydrates:   hamburger bun or  English muffin.   oz (15 mL) syrup.   oz (14 g) jelly.  1 slice of bread.  1 six-inch tortilla.  3 oz (85 g) cooked rice or pasta.  4 oz (113 g) cooked dried beans.  4 oz (113 g) starchy vegetable, such as peas, corn, or potatoes.  4 oz (113 g) hot cereal.  4 oz (113 g) mashed potatoes or  of a large baked potato.  4 oz (113 g) canned or frozen fruit.  4 oz (120 mL) fruit juice.  4-6 crackers.  6 chicken nuggets.  6 oz (170 g) unsweetened dry cereal.  6 oz (170 g) plain fat-free yogurt or yogurt sweetened with artificial sweeteners.  8 oz (240 mL) milk.  8 oz (170 g) fresh fruit or one small piece of fruit.  24 oz (680 g) popped popcorn. Example of carbohydrate counting Sample meal  3 oz (85 g)  chicken breast.  6 oz (170 g) brown rice.  4 oz (113 g) corn.  8 oz (240 mL) milk.  8 oz (170 g) strawberries with sugar-free whipped topping. Carbohydrate calculation 1. Identify the foods that contain carbohydrates: ? Rice. ? Corn. ? Milk. ? Strawberries. 2. Calculate how many servings you have of each food: ? 2 servings rice. ? 1 serving corn. ? 1 serving milk. ? 1 serving strawberries. 3. Multiply each number of servings by 15 g: ? 2 servings rice x 15 g = 30 g. ? 1 serving corn x 15 g = 15 g. ? 1 serving milk x 15 g =  15 g. ? 1 serving strawberries x 15 g = 15 g. 4. Add together all of the amounts to find the total grams of carbohydrates eaten: ? 30 g + 15 g + 15 g + 15 g = 75 g of carbohydrates total. Summary  Carbohydrate counting is a method of keeping track of how many carbohydrates you eat.  Eating carbohydrates naturally increases the amount of sugar (glucose) in the blood.  Counting how many carbohydrates you eat helps keep your blood glucose within normal limits, which helps you manage your diabetes.  A diet and nutrition specialist (registered dietitian) can help you make a meal plan and calculate how many carbohydrates you should have at each meal and snack. This information is not intended to replace advice given to you by your health care provider. Make sure you discuss any questions you have with your health care provider. Document Released: 06/02/2005 Document Revised: 12/25/2016 Document Reviewed: 11/14/2015 Elsevier Patient Education  2020 [employer] Health Maintenance for Postmenopausal Women Menopause is a normal process in which your ability to get pregnant comes to an end. This process happens slowly over many months or years, usually between the ages of 5648 and 7455. Menopause is complete when you have missed your menstrual periods for 12 months. It is important to talk with your health care provider about some of the most common conditions that affect women after menopause (postmenopausal women). These include heart disease, cancer, and bone loss (osteoporosis). Adopting a healthy lifestyle and getting preventive care can help to promote your health and wellness. The actions you take can also lower your chances of developing some of these common conditions. What should I know about menopause? During menopause, you may get a number of symptoms, such as:  Hot flashes. These can be moderate or severe.  Night sweats.  Decrease in sex drive.  Mood swings.  Headaches.  Tiredness.   Irritability.  Memory problems.  Insomnia. Choosing to treat or not to treat these symptoms is a decision that you make with your health care provider. Do I need hormone replacement therapy?  Hormone replacement therapy is effective in treating symptoms that are caused by menopause, such as hot flashes and night sweats.  Hormone replacement carries certain risks, especially as you become older. If you are thinking about using estrogen or estrogen with progestin, discuss the benefits and risks with your health care provider. What is my risk for heart disease and stroke? The risk of heart disease, heart attack, and stroke increases as you age. One of the causes may be a change in the body's hormones during menopause. This can affect how your body uses dietary fats, triglycerides, and cholesterol. Heart attack and stroke are medical emergencies. There are many things that you can do to help prevent heart disease and stroke. Watch your blood pressure  High  blood pressure causes heart disease and increases the risk of stroke. This is more likely to develop in people who have high blood pressure readings, are of African descent, or are overweight.  Have your blood pressure checked: ? Every 3-5 years if you are 2418-61 years of age. ? Every year if you are 61 years old or older. Eat a healthy diet   Eat a diet that includes plenty of vegetables, fruits, low-fat dairy products, and lean protein.  Do not eat a lot of foods that are high in solid fats, added sugars, or sodium. Get regular exercise Get regular exercise. This is one of the most important things you can do for your health. Most adults should:  Try to exercise for at least 150 minutes each week. The exercise should increase your heart rate and make you sweat (moderate-intensity exercise).  Try to do strengthening exercises at least twice each week. Do these in addition to the moderate-intensity exercise.  Spend less time sitting.  Even light physical activity can be beneficial. Other tips  Work with your health care provider to achieve or maintain a healthy weight.  Do not use any products that contain nicotine or tobacco, such as cigarettes, e-cigarettes, and chewing tobacco. If you need help quitting, ask your health care provider.  Know your numbers. Ask your health care provider to check your cholesterol and your blood sugar (glucose). Continue to have your blood tested as directed by your health care provider. Do I need screening for cancer? Depending on your health history and family history, you may need to have cancer screening at different stages of your life. This may include screening for:  Breast cancer.  Cervical cancer.  Lung cancer.  Colorectal cancer. What is my risk for osteoporosis? After menopause, you may be at increased risk for osteoporosis. Osteoporosis is a condition in which bone destruction happens more quickly than new bone creation. To help prevent osteoporosis or the bone fractures that can happen because of osteoporosis, you may take the following actions:  If you are 6619-61 years old, get at least 1,000 mg of calcium and at least 600 mg of vitamin D per day.  If you are older than age 61 but younger than age 61, get at least 1,200 mg of calcium and at least 600 mg of vitamin D per day.  If you are older than age 61, get at least 1,200 mg of calcium and at least 800 mg of vitamin D per day. Smoking and drinking excessive alcohol increase the risk of osteoporosis. Eat foods that are rich in calcium and vitamin D, and do weight-bearing exercises several times each week as directed by your health care provider. How does menopause affect my mental health? Depression may occur at any age, but it is more common as you become older. Common symptoms of depression include:  Low or sad mood.  Changes in sleep patterns.  Changes in appetite or eating patterns.  Feeling an overall lack of  motivation or enjoyment of activities that you previously enjoyed.  Frequent crying spells. Talk with your health care provider if you think that you are experiencing depression. General instructions See your health care provider for regular wellness exams and vaccines. This may include:  Scheduling regular health, dental, and eye exams.  Getting and maintaining your vaccines. These include: ? Influenza vaccine. Get this vaccine each year before the flu season begins. ? Pneumonia vaccine. ? Shingles vaccine. ? Tetanus, diphtheria, and pertussis (Tdap) booster vaccine. Your  health care provider may also recommend other immunizations. Tell your health care provider if you have ever been abused or do not feel safe at home. Summary  Menopause is a normal process in which your ability to get pregnant comes to an end.  This condition causes hot flashes, night sweats, decreased interest in sex, mood swings, headaches, or lack of sleep.  Treatment for this condition may include hormone replacement therapy.  Take actions to keep yourself healthy, including exercising regularly, eating a healthy diet, watching your weight, and checking your blood pressure and blood sugar levels.  Get screened for cancer and depression. Make sure that you are up to date with all your vaccines. This information is not intended to replace advice given to you by your health care provider. Make sure you discuss any questions you have with your health care provider. Document Released: 07/25/2005 Document Revised: 05/26/2018 Document Reviewed: 05/26/2018 Elsevier Patient Education  2020 [employer]

## 2019-02-02 NOTE — Progress Notes (Signed)
Kristina Hoffman 10-19-1955 [id number]    History:    Presents for annual exam.  Postmenopausal no HRT with no bleeding.  Normal Pap and mammogram history.  2011- colonoscopy.  2018 normal DEXA.  Last hemoglobin A1c 11.2 but did not follow-up with primary care for treatment, states has been on metformin in the past and did not tolerate well.  Not sexually active for years.  Past medical history, past surgical history, family history and social history were all reviewed and documented in the EPIC chart.  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  Sister died from complications of diabetes.  2 grown children both doing well.  ROS:  A ROS was performed and pertinent positives and negatives are included.  Exam:  Vitals:   02/02/19 1557  BP: 132/80  Weight: 176 lb (79.8 kg)  Height: 5\' 4"  (1.626 m)   Body mass index is 30.21 kg/m.   General appearance:  Normal Thyroid:  Symmetrical, normal in size, without palpable masses or nodularity. Respiratory  Auscultation:  Clear without wheezing or rhonchi Cardiovascular  Auscultation:  Regular rate, without rubs, murmurs or gallops  Edema/varicosities:  Not grossly evident Abdominal  Soft,nontender, without masses, guarding or rebound.  Liver/spleen:  No organomegaly noted  Hernia:  None appreciated  Skin  Inspection:  Grossly normal   Breasts: Examined lying and sitting.     Right: Without masses, retractions, discharge or axillary adenopathy.     Left: Without masses, retractions, discharge or axillary adenopathy. Gentitourinary   Inguinal/mons:  Normal without inguinal adenopathy  External genitalia:  Normal  BUS/Urethra/Skene's glands:  Normal  Vagina:  Normal  Cervix:  Normal  Uterus:  normal in size, shape and contour.  Midline and mobile  Adnexa/parametria:     Rt: Without masses or tenderness.   Lt: Without masses or tenderness.  Anus and perineum: Normal  Digital rectal exam: Normal sphincter tone without  palpated masses or tenderness  Assessment/Plan:  63 y.o. SBF G3, P2 for annual exam with no complaints.  Postmenopausal/no HRT/no bleeding Diabetes with elevated hemoglobin A1c  Plan: Hemoglobin A1c, if greater than 7 reviewed importance of follow-up with primary care for possible medication.  SBEs, continue annual screening mammogram due this month will schedule.  Reviewed importance of weightbearing and balance type exercise, low-carb diet, avoiding simple sugars.  Vitamin D 2000 daily encouraged.  Pap normal 2018, new screening guidelines reviewed.  CBC, CMP, instructed to get a fasting lipid panel with follow-up with primary care.  Agreeable with plan.    [place], 4:04 PM 02/02/2019

## 2019-02-03 ENCOUNTER — Telehealth: Payer: Self-pay | Admitting: *Deleted

## 2019-02-03 DIAGNOSIS — R7309 Other abnormal glucose: Secondary | ICD-10-CM

## 2019-02-03 LAB — COMPREHENSIVE METABOLIC PANEL
AG Ratio: 1.4 (calc) (ref 1.0–2.5)
ALT: 33 U/L — ABNORMAL HIGH (ref 6–29)
AST: 24 U/L (ref 10–35)
Albumin: 4.1 g/dL (ref 3.6–5.1)
Alkaline phosphatase (APISO): 89 U/L (ref 37–153)
BUN: 11 mg/dL (ref 7–25)
CO2: 25 mmol/L (ref 20–32)
Calcium: 9.7 mg/dL (ref 8.6–10.4)
Chloride: 100 mmol/L (ref 98–110)
Creat: 0.77 mg/dL (ref 0.50–0.99)
Globulin: 2.9 g/dL (calc) (ref 1.9–3.7)
Glucose, Bld: 325 mg/dL — ABNORMAL HIGH (ref 65–99)
Potassium: 4.6 mmol/L (ref 3.5–5.3)
Sodium: 134 mmol/L — ABNORMAL LOW (ref 135–146)
Total Bilirubin: 0.6 mg/dL (ref 0.2–1.2)
Total Protein: 7 g/dL (ref 6.1–8.1)

## 2019-02-03 LAB — CBC WITH DIFFERENTIAL/PLATELET
Absolute Monocytes: 435 cells/uL (ref 200–950)
Basophils Absolute: 27 cells/uL (ref 0–200)
Basophils Relative: 0.4 %
Eosinophils Absolute: 48 cells/uL (ref 15–500)
Eosinophils Relative: 0.7 %
HCT: 40.5 % (ref 35.0–45.0)
Hemoglobin: 13.2 g/dL (ref 11.7–15.5)
Lymphs Abs: 2774 cells/uL (ref 850–3900)
MCH: 28.1 pg (ref 27.0–33.0)
MCHC: 32.6 g/dL (ref 32.0–36.0)
MCV: 86.2 fL (ref 80.0–100.0)
MPV: 11 fL (ref 7.5–12.5)
Monocytes Relative: 6.4 %
Neutro Abs: 3516 cells/uL (ref 1500–7800)
Neutrophils Relative %: 51.7 %
Platelets: 297 10*3/uL (ref 140–400)
RBC: 4.7 10*6/uL (ref 3.80–5.10)
RDW: 13 % (ref 11.0–15.0)
Total Lymphocyte: 40.8 %
WBC: 6.8 10*3/uL (ref 3.8–10.8)

## 2019-02-03 LAB — HEMOGLOBIN A1C
Hgb A1c MFr Bld: 13.2 % of total Hgb — ABNORMAL HIGH (ref <5.7)
Mean Plasma Glucose: 332 (calc)
eAG (mmol/L): 18.4 (calc)

## 2019-02-03 NOTE — Telephone Encounter (Signed)
Referral placed in epic at [facility] they will call to schedule.

## 2019-02-03 NOTE — Telephone Encounter (Signed)
-----   Message from Huel Cote, NP sent at 02/03/2019 11:56 AM EDT ----- Pl schedule appt with endocrinologist, call and review it is imperative for appt with endocrinologist for treatment of diabetes, random blood sugar is 325, hgb a1c is 13!  if not treated kidney disease, vision loss, CBC normal.  (She did not follow up as directed last year. ) thanks

## 2019-02-04 LAB — URINALYSIS, COMPLETE W/RFL CULTURE
Bacteria, UA: NONE SEEN /HPF
Bilirubin Urine: NEGATIVE
Hgb urine dipstick: NEGATIVE
Hyaline Cast: NONE SEEN /LPF
Ketones, ur: NEGATIVE
Leukocyte Esterase: NEGATIVE
Nitrites, Initial: NEGATIVE
Protein, ur: NEGATIVE
RBC / HPF: NONE SEEN /HPF (ref 0–2)
Specific Gravity, Urine: 1.042 — ABNORMAL HIGH (ref 1.001–1.03)
Squamous Epithelial / LPF: NONE SEEN /HPF (ref <=5)
pH: 5.5 (ref 5.0–8.0)

## 2019-02-04 LAB — URINE CULTURE
MICRO NUMBER:: 792929
Result:: NO GROWTH
SPECIMEN QUALITY:: ADEQUATE

## 2019-02-04 LAB — CULTURE INDICATED

## 2019-02-04 NOTE — Telephone Encounter (Signed)
Patient scheduled on 03/04/19 @ 2:00pm

## 2019-02-15 ENCOUNTER — Encounter: Payer: Self-pay | Admitting: Obstetrics & Gynecology

## 2019-03-02 ENCOUNTER — Other Ambulatory Visit: Payer: Self-pay

## 2019-03-04 ENCOUNTER — Encounter: Payer: Self-pay | Admitting: Internal Medicine

## 2019-03-04 ENCOUNTER — Other Ambulatory Visit: Payer: Self-pay

## 2019-03-04 ENCOUNTER — Ambulatory Visit: Payer: 59 | Admitting: Internal Medicine

## 2019-03-04 VITALS — BP 132/78 | HR 101 | Temp 98.1°F | Ht 64.0 in | Wt 176.2 lb

## 2019-03-04 DIAGNOSIS — R739 Hyperglycemia, unspecified: Secondary | ICD-10-CM | POA: Diagnosis not present

## 2019-03-04 DIAGNOSIS — E785 Hyperlipidemia, unspecified: Secondary | ICD-10-CM

## 2019-03-04 DIAGNOSIS — E1165 Type 2 diabetes mellitus with hyperglycemia: Secondary | ICD-10-CM

## 2019-03-04 LAB — GLUCOSE, POCT (MANUAL RESULT ENTRY): POC Glucose: 261 mg/dl — AB (ref 70–99)

## 2019-03-04 NOTE — Progress Notes (Signed)
Name: Kristina Hoffman  MRN/ DOB: [id number], [birth year]   Age/ Sex: 63 y.o., female    PCP: Patient, No Pcp Per   Reason for Endocrinology Evaluation: Type 2 Diabetes Mellitus     Date of Initial Endocrinology Visit: 03/07/2019     PATIENT IDENTIFIER: Ms. Kristina Hoffman is a 63 y.o. female with a past medical history of T2DM . The patient presented for initial endocrinology clinic visit on 03/07/2019 for consultative assistance with her diabetes management.    HPI: [name] was    Diagnosed with DM in 2008 . She used to see Dr. Buddy Duty in the past and was able to control her glucose with dietary changes and with mineral and natural supplements. She was last seen by Dr. Buddy Duty ~ 10 yrs ago. She does admit to getting tired of her dietary changes and stopped following her previous regimen until she presented for a health check evaluation and was noted to have an A1c of 13.2%.  Hemoglobin A1c has ranged from 8.4% in 2015, peaking at 13.2% in 2020. Patient has required hospitalization within the last 1 year from hyper or hypoglycemia: no  Today she bring me a sheet with details of how to do the fat -free cleanse and is planinning on following this for a total of 60 days,  She has done 30 days so far. She also bring a small print sheet that has any medical diagnosis and what supplements to take to cure the condition.    HOME DIABETES REGIMEN: N/A   Statin:No ACE-I/ARB:No Prior Diabetic Education: No   DIABETIC COMPLICATIONS: Microvascular complications:    Denies: CKD, retinopathy , neuropathy  Last eye exam: Completed 02/2018  Macrovascular complications:    Denies: CAD, PVD, CVA   PAST HISTORY: Past Medical History:  Past Medical History:  Diagnosis Date  . Diabetes mellitus   . Vitamin D deficiency    Past Surgical History:  Past Surgical History:  Procedure Laterality Date  . CHOLECYSTECTOMY    . ENDOMETRIAL ABLATION        Social History:  reports  that she has never smoked. She has never used smokeless tobacco. She reports that she does not drink alcohol or use drugs. Family History:  Family History  Problem Relation Age of Onset  . Cancer Maternal Grandmother        colon  . Hypertension Mother   . Diabetes Father   . Diabetes Sister      HOME MEDICATIONS: Allergies as of 03/04/2019   No Known Allergies     Medication List       Accurate as of March 04, 2019 11:59 PM. If you have any questions, ask your nurse or doctor.        CHROMIUM ASPARTATE PO Take by mouth.   MAG-SR PLUS CALCIUM PO Take by mouth.   magnesium citrate Soln Take 1 Bottle by mouth once.   NON FORMULARY LIQUID MINERALS AND VITAMIN   VANADIUM PO Take by mouth.        ALLERGIES: No Known Allergies   REVIEW OF SYSTEMS: A comprehensive ROS was conducted with the patient and is negative except as per HPI and below:  Review of Systems  Constitutional: Negative for chills and fever.  HENT: Negative for congestion and sore throat.   Eyes: Negative for blurred vision and pain.  Respiratory: Negative for cough and shortness of breath.   Cardiovascular: Negative for chest pain and palpitations.  Gastrointestinal: Negative for diarrhea and nausea.  Genitourinary: Negative for frequency.  Neurological: Negative for tingling and tremors.  Endo/Heme/Allergies: Negative for polydipsia.  Psychiatric/Behavioral: Negative for depression. The patient is not nervous/anxious.       OBJECTIVE:   VITAL SIGNS: BP 132/78 (BP Location: Left Arm, Patient Position: Sitting, Cuff Size: Large)   Pulse (!) 101   Temp 98.1 F (36.7 C)   Ht 5\' 4"  (1.626 m)   Wt 176 lb 3.2 oz (79.9 kg)   LMP 10/15/2003   SpO2 98%   BMI 30.24 kg/m    PHYSICAL EXAM:  General: Pt appears well and is in NAD  Hydration: Well-hydrated with moist mucous membranes and good skin turgor  HEENT: Head: Unremarkable with good dentition. Oropharynx clear without exudate.   Eyes: External eye exam normal without stare, lid lag or exophthalmos.  EOM intact.    Neck: General: Supple without adenopathy or carotid bruits. Thyroid: Thyroid size normal.  No goiter or nodules appreciated. No thyroid bruit.  Lungs: Clear with good BS bilat with no rales, rhonchi, or wheezes  Heart: RRR with normal S1 and S2 and no gallops; no murmurs; no rub  Abdomen: Normoactive bowel sounds, soft, nontender, without masses or organomegaly palpable  Extremities:  Lower extremities - No pretibial edema. No lesions.  Skin: Normal texture and temperature to palpation. No rash noted. No Acanthosis nigricans/skin tags. No lipohypertrophy.  Neuro: MS is good with appropriate affect, pt is alert and Ox3    DM foot exam: 03/04/2019  The skin of the feet is intact without sores or ulcerations. The pedal pulses are 2+ on right and 2+ on left. The sensation is intact to a screening 5.07, 10 gram monofilament bilaterally   DATA REVIEWED:  Lab Results  Component Value Date   HGBA1C 13.2 (H) 02/02/2019   HGBA1C 11.5 (H) 01/25/2018   HGBA1C 10.7 (H) 01/11/2015   Lab Results  Component Value Date   LDLCALC 137 (H) 01/25/2018   CREATININE 0.77 02/02/2019   No results found for: [facility]  Lab Results  Component Value Date   CHOL 218 (H) 01/25/2018   HDL 60 01/25/2018   LDLCALC 137 (H) 01/25/2018   TRIG 104 01/25/2018   CHOLHDL 3.6 01/25/2018       In-office BG 261 mg/dL  ASSESSMENT / PLAN / RECOMMENDATIONS:   1) Type 2 Diabetes Mellitus, Poorly controlled, Without complications - Most recent A1c of 13.2 %. Goal A1c < 7.0 %.   Plan: GENERAL: I have discussed with the patient the pathophysiology of diabetes. We went over the natural progression of the disease. We talked about both insulin resistance and insulin deficiency. We stressed the importance of lifestyle changes including diet and exercise. I explained the complications associated with diabetes including retinopathy,  nephropathy, neuropathy as well as increased risk of cardiovascular disease. We went over the benefit seen with glycemic control.   I explained to the patient that diabetic patients are at higher than normal risk for amputations.   I explained to the patient I have no experience in natural supplements/ minerals, I have advised her to seek a natural doctor , as I will not be of any help in this due to lack of training.   I explained that with an A1c > 10.% insulin is the preferred mode of treatment to bring glucose levels down safely and effectively.   Pt declines this and would like to continue current regimen until next visit.   MEDICATIONS:  Minerals/Natural supplements that the pt uses   EDUCATION /  INSTRUCTIONS:  BG monitoring instructions: Patient is instructed to check her blood sugars 2 times a day, fasting and bedtime.  Call [facility] if: BG persistently < 70 or > 300. . I reviewed the Rule of 15 for the treatment of hypoglycemia in detail with the patient. Literature supplied.   2) Diabetic complications:   Eye: Does not have known diabetic retinopathy.   Neuro/ Feet: Does not have known diabetic peripheral neuropathy.  Renal: Patient does not have known baseline CKD. She is not on an ACEI/ARB at present.Will check urine albumin/creatinine ratio on next visit.    3) Lipids: Patient is not on a statin. She declined statins, we discussed the cardiovascular benefits of statins and that I strongly recommend she goes on them given her age.    F/u in 2 months    Signed electronically by: Lyndle HerrlichAbby Jaralla , MD  [facility]  [facility] [address] [place], [place] [address] Phone: [phone] FAX: [phone]   CC: Patient, No Pcp Per No address on file Phone: None  Fax: None    Return to Endocrinology clinic as below: Future Appointments  Date Time Provider Department Center  05/06/2019  3:00 PM  , Konrad DoloresIbtehal Jaralla, MD [facility] None  02/06/2020  4:00 PM Harrington ChallengerYoung, Nancy J, NP [facility]

## 2019-03-04 NOTE — Patient Instructions (Signed)
-   Please check sugar fasting and bedtime when you can    - Fasting sugar goal is 70-130 mg/dL  - During the day sugar goal is less then a 180 mg/dL    - Exercise 150 minutes per week

## 2019-03-07 ENCOUNTER — Encounter: Payer: Self-pay | Admitting: Internal Medicine

## 2019-03-07 DIAGNOSIS — E785 Hyperlipidemia, unspecified: Secondary | ICD-10-CM | POA: Insufficient documentation

## 2019-03-07 DIAGNOSIS — E1165 Type 2 diabetes mellitus with hyperglycemia: Secondary | ICD-10-CM | POA: Insufficient documentation

## 2019-03-07 NOTE — Progress Notes (Signed)
Left message for patient to call if any questions did review recommendations of endocrinologist to start a statin and insulin which patient had declined at office visit.  Reviewed importance of managing diabetes to prevent complications and heart disease.

## 2019-03-14 ENCOUNTER — Other Ambulatory Visit: Payer: Self-pay

## 2019-03-14 ENCOUNTER — Telehealth: Payer: Self-pay | Admitting: Internal Medicine

## 2019-03-14 MED ORDER — ONETOUCH ULTRASOFT LANCETS MISC: 12 refills | Status: AC

## 2019-03-14 NOTE — Telephone Encounter (Signed)
Sent!

## 2019-03-14 NOTE — Telephone Encounter (Signed)
MEDICATION: Lancets  PHARMACY:  [id number] - [place], [place] - [address] AT [place] OF [address]  IS THIS A 90 DAY SUPPLY : Yes  IS PATIENT OUT OF MEDICATION:   IF NOT; HOW MUCH IS LEFT:   LAST APPOINTMENT DATE: @9 /18/2020  NEXT APPOINTMENT DATE:@11 /20/2020  DO WE HAVE YOUR PERMISSION TO LEAVE A DETAILED MESSAGE: Yes  OTHER COMMENTS:    **Let patient know to contact pharmacy at the end of the day to make sure medication is ready. **  ** Please notify patient to allow 48-72 hours to process**  **Encourage patient to contact the pharmacy for refills or they can request refills through [facility]**

## 2019-03-14 NOTE — Telephone Encounter (Signed)
MEDICATION: One Touch Verio Test Strips  PHARMACY:  [id number] - [place], [place] - [address] AT [facility] OF [address]  IS THIS A 90 DAY SUPPLY : Yes  IS PATIENT OUT OF MEDICATION:   IF NOT; HOW MUCH IS LEFT:   LAST APPOINTMENT DATE: @9 /18/2020  NEXT APPOINTMENT DATE:@11 /20/2020  DO WE HAVE YOUR PERMISSION TO LEAVE A DETAILED MESSAGE: Yes

## 2019-03-15 ENCOUNTER — Other Ambulatory Visit: Payer: Self-pay

## 2019-03-15 ENCOUNTER — Encounter: Payer: Self-pay | Admitting: Gynecology

## 2019-03-15 MED ORDER — GLUCOSE BLOOD VI STRP: ORAL_STRIP | 12 refills | Status: DC

## 2019-03-15 NOTE — Telephone Encounter (Signed)
Sent!

## 2019-05-06 ENCOUNTER — Ambulatory Visit: Payer: 59 | Admitting: Internal Medicine

## 2019-06-03 ENCOUNTER — Ambulatory Visit: Payer: 59 | Admitting: Internal Medicine

## 2019-08-05 ENCOUNTER — Encounter: Payer: Self-pay | Admitting: Internal Medicine

## 2019-08-05 ENCOUNTER — Ambulatory Visit: Payer: 59 | Admitting: Internal Medicine

## 2019-08-05 ENCOUNTER — Other Ambulatory Visit: Payer: Self-pay

## 2019-08-05 VITALS — BP 162/82 | HR 106 | Temp 98.5°F | Ht 64.0 in | Wt 173.2 lb

## 2019-08-05 DIAGNOSIS — E1165 Type 2 diabetes mellitus with hyperglycemia: Secondary | ICD-10-CM | POA: Diagnosis not present

## 2019-08-05 DIAGNOSIS — R030 Elevated blood-pressure reading, without diagnosis of hypertension: Secondary | ICD-10-CM | POA: Diagnosis not present

## 2019-08-05 LAB — POCT GLYCOSYLATED HEMOGLOBIN (HGB A1C): Hemoglobin A1C: 9.7 % — AB (ref 4.0–5.6)

## 2019-08-05 NOTE — Progress Notes (Signed)
Name: Kristina Hoffman  Age/ Sex: 64 y.o., female   MRN/ DOB: [id number], [birth year]     PCP: Patient, No Pcp Per   Reason for Endocrinology Evaluation: Type 2 Diabetes Mellitus  Initial Endocrine Consultative Visit: 03/07/2019    PATIENT IDENTIFIER: Kristina Hoffman is a 64 y.o. female with a past medical history of T2Dm. The patient has followed with Endocrinology clinic since 03/07/2019 for consultative assistance with management of her diabetes.  DIABETIC HISTORY:  Kristina Hoffman was diagnosed with DM in 2008.She used to see Dr. Sharl Ma in the past and was able to control her glucose with dietary changes and with mineral and natural supplements. She was last seen by Dr. Sharl Ma in ~ 2010  Her hemoglobin A1c has ranged from 8.4% in 2015, peaking at 13.2% in 2020.  ON her initial visit to our clinic she had an A1c 13.2% , she also brought a sheet with details of how to do the fat -free cleanse. She declined glycemic agents    SUBJECTIVE:   During the last visit (03/07/2019): A1c 13.2% patient opted to use minerals and lifestyle changes.  Today (08/05/2019): Ms. Rhem is here for follow-up on diabetes management.  She checks her blood sugars 1 times daily. The patient has not had hypoglycemic episodes since the last clinic visit. Otherwise, the patient has not required any recent emergency interventions for hypoglycemia and has not had recent hospitalizations secondary to hyper or hypoglycemic episodes.   Patient has lost weight, denies any shortness of breath or chest pain, denies any GI symptoms.  HOME DIABETES REGIMEN:   Minerals/Natural supplements    METER DOWNLOAD SUMMARY: Date range evaluated: 2/6-2/19/2021 Fingerstick Blood Glucose Tests = 9 Average Number Tests/Day = 0.6 Overall Mean FS Glucose = 237   BG Ranges: Low = 205  High = 357   Hypoglycemic Events/30 Days: BG < 50 = 0 Episodes of symptomatic severe hypoglycemia = 0   DIABETIC  COMPLICATIONS: Microvascular complications:    Denies: CKD, retinopathy , neuropathy  Last eye exam: Completed 02/2018  Macrovascular complications:    Denies: CAD, PVD, CVA  HISTORY:  Past Medical History:  Past Medical History:  Diagnosis Date  . Diabetes mellitus   . Vitamin D deficiency    Past Surgical History:  Past Surgical History:  Procedure Laterality Date  . CHOLECYSTECTOMY    . ENDOMETRIAL ABLATION      Social History:  reports that she has never smoked. She has never used smokeless tobacco. She reports that she does not drink alcohol or use drugs. Family History:  Family History  Problem Relation Age of Onset  . Cancer Maternal Grandmother        colon  . Hypertension Mother   . Diabetes Father   . Diabetes Sister      HOME MEDICATIONS: Allergies as of 08/05/2019   No Known Allergies     Medication List       Accurate as of August 05, 2019  3:51 PM. If you have any questions, ask your nurse or doctor.        B-12 PO Take by mouth.   CHROMIUM ASPARTATE PO Take by mouth.   glucose blood test strip Use as instructed to test blood sugar 2 times daily E11.65   MAG-SR PLUS CALCIUM PO Take by mouth.   magnesium citrate Soln Take 1 Bottle by mouth once.   NON FORMULARY LIQUID MINERALS AND VITAMIN   onetouch ultrasoft lancets Use as instructed to  test blood sugar 2 times daily E11.65   VANADIUM PO Take by mouth.   Vitamin D3 50 MCG (2000 UT) capsule Take 2,000 Units by mouth daily.        OBJECTIVE:   Vital Signs: BP (!) 162/82 (BP Location: Right Arm, Patient Position: Sitting, Cuff Size: Normal)   Pulse (!) 106   Temp 98.5 F (36.9 C)   Ht 5\' 4"  (1.626 m)   Wt 173 lb 3.2 oz (78.6 kg)   LMP 10/15/2003   SpO2 98%   BMI 29.73 kg/m   Wt Readings from Last 3 Encounters:  08/05/19 173 lb 3.2 oz (78.6 kg)  03/04/19 176 lb 3.2 oz (79.9 kg)  02/02/19 176 lb (79.8 kg)     Exam: General: Pt appears well and is in NAD   Lungs: Clear with good BS bilat with no rales, rhonchi, or wheezes  Heart: RRR with normal S1 and S2 and no gallops; no murmurs; no rub  Abdomen: Normoactive bowel sounds, soft, nontender, without masses or organomegaly palpable  Extremities: No pretibial edema.   Skin: Normal texture and temperature to palpation.  Neuro: MS is good with appropriate affect, pt is alert and Ox3      DM foot exam: 03/04/2019  The skin of the feet is intact without sores or ulcerations. The pedal pulses are 2+ on right and 2+ on left. The sensation is intact to a screening 5.07, 10 gram monofilament bilaterally      DATA REVIEWED:  Lab Results  Component Value Date   HGBA1C 9.7 (A) 08/05/2019   HGBA1C 13.2 (H) 02/02/2019   HGBA1C 11.5 (H) 01/25/2018   Lab Results  Component Value Date   LDLCALC 137 (H) 01/25/2018   CREATININE 0.77 02/02/2019     Lab Results  Component Value Date   CHOL 218 (H) 01/25/2018   HDL 60 01/25/2018   LDLCALC 137 (H) 01/25/2018   TRIG 104 01/25/2018   CHOLHDL 3.6 01/25/2018         ASSESSMENT / PLAN / RECOMMENDATIONS:   1) Type 2 Diabetes Mellitus, Poorly controlled, Without complications - Most recent A1c of 9.7 %. Goal A1c < 7.0 %.    -Improved glycemic control with lifestyle changes in minimal use. -I have discussed with the patient that the A1c has improved from 13.2% to 9.7%, we also discussed that the goal is 7.0% to reduce the risk of microvascular complications. -Patient states that she still can do more and add more to her regimen to improve her glycemic control.  We did discuss brisk walking as a form of exercise, we did discuss the goal is to get 250 minutes/week.   MEDICATIONS:  Minerals/natural supplements  EDUCATION / INSTRUCTIONS:  BG monitoring instructions: Patient is instructed to check her blood sugars 2-3 times per week  Call [facility] if: BG persistently < 70 or > 300. . I reviewed the Rule of 15 for the  treatment of hypoglycemia in detail with the patient. Literature supplied.   2) Diabetic complications:   Eye: Does not have known diabetic retinopathy.  Patient encouraged to schedule an eye exam, she will if this would happen around June of this year.  Renal: Patient does not have known baseline CKD.  We will check microalbumin urea on next visit   3) Lipids: On her initial visit we did discuss the ADA recommendations of statin use, and the cardiovascular benefits ,patient has declined would prefer minerals/natural supplements. 4) Hypertension: This is above goal  of < 140/90 mmHg.  Patient is asymptomatic, patient will monitor this at home.   F/U in 4 months   Signed electronically by: Mack Guise, MD  [facility]  [facility] [address] [place], [place] [address] Phone: [phone] FAX: [phone]   CC: Patient, No Pcp Per No address on file Phone: None  Fax: None  Return to Endocrinology clinic as below: Future Appointments  Date Time Provider [place]  12/02/2019  3:40 PM Rannie Craney, Melanie Crazier, MD [facility] None  02/06/2020  4:00 PM Huel Cote, NP [facility]

## 2019-08-05 NOTE — Patient Instructions (Signed)
Keep up the good work

## 2019-12-02 ENCOUNTER — Ambulatory Visit: Payer: 59 | Admitting: Internal Medicine

## 2019-12-09 ENCOUNTER — Ambulatory Visit: Payer: 59 | Admitting: Internal Medicine

## 2020-02-06 ENCOUNTER — Encounter: Payer: Self-pay | Admitting: Nurse Practitioner

## 2020-02-06 ENCOUNTER — Ambulatory Visit (INDEPENDENT_AMBULATORY_CARE_PROVIDER_SITE_OTHER): Payer: 59 | Admitting: Nurse Practitioner

## 2020-02-06 ENCOUNTER — Other Ambulatory Visit: Payer: Self-pay

## 2020-02-06 VITALS — BP 154/80 | Ht 64.0 in | Wt 173.0 lb

## 2020-02-06 DIAGNOSIS — Z01419 Encounter for gynecological examination (general) (routine) without abnormal findings: Secondary | ICD-10-CM | POA: Diagnosis not present

## 2020-02-06 DIAGNOSIS — E559 Vitamin D deficiency, unspecified: Secondary | ICD-10-CM

## 2020-02-06 NOTE — Patient Instructions (Addendum)
Schedule colonoscopy!  Health Maintenance for Postmenopausal Women Menopause is a normal process in which your ability to get pregnant comes to an end. This process happens slowly over many months or years, usually between the ages of 48 and 55. Menopause is complete when you have missed your menstrual periods for 12 months. It is important to talk with your health care provider about some of the most common conditions that affect women after menopause (postmenopausal women). These include heart disease, cancer, and bone loss (osteoporosis). Adopting a healthy lifestyle and getting preventive care can help to promote your health and wellness. The actions you take can also lower your chances of developing some of these common conditions. What should I know about menopause? During menopause, you may get a number of symptoms, such as:  Hot flashes. These can be moderate or severe.  Night sweats.  Decrease in sex drive.  Mood swings.  Headaches.  Tiredness.  Irritability.  Memory problems.  Insomnia. Choosing to treat or not to treat these symptoms is a decision that you make with your health care provider. Do I need hormone replacement therapy?  Hormone replacement therapy is effective in treating symptoms that are caused by menopause, such as hot flashes and night sweats.  Hormone replacement carries certain risks, especially as you become older. If you are thinking about using estrogen or estrogen with progestin, discuss the benefits and risks with your health care provider. What is my risk for heart disease and stroke? The risk of heart disease, heart attack, and stroke increases as you age. One of the causes may be a change in the body's hormones during menopause. This can affect how your body uses dietary fats, triglycerides, and cholesterol. Heart attack and stroke are medical emergencies. There are many things that you can do to help prevent heart disease and stroke. Watch your  blood pressure  High blood pressure causes heart disease and increases the risk of stroke. This is more likely to develop in people who have high blood pressure readings, are of African descent, or are overweight.  Have your blood pressure checked: ? Every 3-5 years if you are 18-39 years of age. ? Every year if you are 40 years old or older. Eat a healthy diet   Eat a diet that includes plenty of vegetables, fruits, low-fat dairy products, and lean protein.  Do not eat a lot of foods that are high in solid fats, added sugars, or sodium. Get regular exercise Get regular exercise. This is one of the most important things you can do for your health. Most adults should:  Try to exercise for at least 150 minutes each week. The exercise should increase your heart rate and make you sweat (moderate-intensity exercise).  Try to do strengthening exercises at least twice each week. Do these in addition to the moderate-intensity exercise.  Spend less time sitting. Even light physical activity can be beneficial. Other tips  Work with your health care provider to achieve or maintain a healthy weight.  Do not use any products that contain nicotine or tobacco, such as cigarettes, e-cigarettes, and chewing tobacco. If you need help quitting, ask your health care provider.  Know your numbers. Ask your health care provider to check your cholesterol and your blood sugar (glucose). Continue to have your blood tested as directed by your health care provider. Do I need screening for cancer? Depending on your health history and family history, you may need to have cancer screening at different stages of   your life. This may include screening for:  Breast cancer.  Cervical cancer.  Lung cancer.  Colorectal cancer. What is my risk for osteoporosis? After menopause, you may be at increased risk for osteoporosis. Osteoporosis is a condition in which bone destruction happens more quickly than new bone  creation. To help prevent osteoporosis or the bone fractures that can happen because of osteoporosis, you may take the following actions:  If you are 19-50 years old, get at least 1,000 mg of calcium and at least 600 mg of vitamin D per day.  If you are older than age 50 but younger than age 70, get at least 1,200 mg of calcium and at least 600 mg of vitamin D per day.  If you are older than age 70, get at least 1,200 mg of calcium and at least 800 mg of vitamin D per day. Smoking and drinking excessive alcohol increase the risk of osteoporosis. Eat foods that are rich in calcium and vitamin D, and do weight-bearing exercises several times each week as directed by your health care provider. How does menopause affect my mental health? Depression may occur at any age, but it is more common as you become older. Common symptoms of depression include:  Low or sad mood.  Changes in sleep patterns.  Changes in appetite or eating patterns.  Feeling an overall lack of motivation or enjoyment of activities that you previously enjoyed.  Frequent crying spells. Talk with your health care provider if you think that you are experiencing depression. General instructions See your health care provider for regular wellness exams and vaccines. This may include:  Scheduling regular health, dental, and eye exams.  Getting and maintaining your vaccines. These include: ? Influenza vaccine. Get this vaccine each year before the flu season begins. ? Pneumonia vaccine. ? Shingles vaccine. ? Tetanus, diphtheria, and pertussis (Tdap) booster vaccine. Your health care provider may also recommend other immunizations. Tell your health care provider if you have ever been abused or do not feel safe at home. Summary  Menopause is a normal process in which your ability to get pregnant comes to an end.  This condition causes hot flashes, night sweats, decreased interest in sex, mood swings, headaches, or lack of  sleep.  Treatment for this condition may include hormone replacement therapy.  Take actions to keep yourself healthy, including exercising regularly, eating a healthy diet, watching your weight, and checking your blood pressure and blood sugar levels.  Get screened for cancer and depression. Make sure that you are up to date with all your vaccines. This information is not intended to replace advice given to you by your health care provider. Make sure you discuss any questions you have with your health care provider. Document Revised: 05/26/2018 Document Reviewed: 05/26/2018 Elsevier Patient Education  2020 Elsevier Inc.  

## 2020-02-06 NOTE — Progress Notes (Signed)
° °  Kristina Hoffman [birth year] [id number]   History:  64 y.o. G3P2002 presents for annual exam without GYN complaints. Postmenopausal - no HRT, no bleeding. Normal pap and mammogram history. T2DM managed by endocrinology, managing with diet and exercise, could not tolerate metformin. Not sexually active.    Gynecologic History Patient's last menstrual period was 10/15/2003.   Contraception: post menopausal status Last Pap: 01/09/2017. Results were: normal Last mammogram: 02/25/2019. Results were: normal Last colonoscopy: 2011. Results were: normal Last Dexa: 02/03/2017. Results were: normal  Past medical history, past surgical history, family history and social history were all reviewed and documented in the EPIC chart.  ROS:  A ROS was performed and pertinent positives and negatives are included.  Exam:  Vitals:   02/06/20 1556  BP: (!) 154/80  Weight: 173 lb (78.5 kg)  Height: 5\' 4"  (1.626 m)   Body mass index is 29.7 kg/m.  General appearance:  Normal Thyroid:  Symmetrical, normal in size, without palpable masses or nodularity. Respiratory  Auscultation:  Clear without wheezing or rhonchi Cardiovascular  Auscultation:  Regular rate, without rubs, murmurs or gallops  Edema/varicosities:  Not grossly evident Abdominal  Soft,nontender, without masses, guarding or rebound.  Liver/spleen:  No organomegaly noted  Hernia:  None appreciated  Skin  Inspection:  Grossly normal   Breasts: Examined lying and sitting.   Right: Without masses, retractions, discharge or axillary adenopathy.   Left: Without masses, retractions, discharge or axillary adenopathy. Gentitourinary   Inguinal/mons:  Normal without inguinal adenopathy  External genitalia:  Normal  BUS/Urethra/Skene's glands:  Normal  Vagina:  Normal  Cervix:  Normal  Uterus:  Normal in size, shape and contour.  Midline and mobile  Adnexa/parametria:     Rt: Without masses or tenderness.   Lt: Without masses or  tenderness.  Anus and perineum: Normal  Digital rectal exam: Normal sphincter tone without palpated masses or tenderness  Assessment/Plan:  64 y.o. 79 for annual exam.   Well female exam with routine gynecological exam - Plan: CBC with Differential/Platelet, Comprehensive metabolic panel. Education provided on SBEs, importance of preventative screenings, current guidelines, high calcium diet, regular exercise, and multivitamin daily.   Vitamin D deficiency - Plan: VITAMIN D 25 Hydroxy (Vit-D Deficiency, Fractures)  Follow up in 1 year for annual      Mariea Mcmartin A Grabiela [facility], 4:20 PM 02/06/2020

## 2020-02-08 LAB — COMPREHENSIVE METABOLIC PANEL
ALT: 26 IU/L (ref 0–32)
AST: 16 IU/L (ref 0–40)
Albumin/Globulin Ratio: 1.3 (ref 1.2–2.2)
Albumin: 4.3 g/dL (ref 3.8–4.8)
Alkaline Phosphatase: 103 IU/L (ref 48–121)
BUN/Creatinine Ratio: 16 (ref 12–28)
BUN: 11 mg/dL (ref 8–27)
Bilirubin Total: 0.4 mg/dL (ref 0.0–1.2)
CO2: 21 mmol/L (ref 20–29)
Calcium: 10.1 mg/dL (ref 8.7–10.3)
Chloride: 102 mmol/L (ref 96–106)
Creatinine, Ser: 0.7 mg/dL (ref 0.57–1.00)
GFR calc Af Amer: 108 mL/min/{1.73_m2} (ref >59)
GFR calc non Af Amer: 94 mL/min/{1.73_m2} (ref >59)
Globulin, Total: 3.4 g/dL (ref 1.5–4.5)
Glucose: 278 mg/dL — ABNORMAL HIGH (ref 65–99)
Potassium: 4.1 mmol/L (ref 3.5–5.2)
Sodium: 139 mmol/L (ref 134–144)
Total Protein: 7.7 g/dL (ref 6.0–8.5)

## 2020-02-08 LAB — CBC WITH DIFFERENTIAL/PLATELET
Basophils Absolute: 0 10*3/uL (ref 0.0–0.2)
Basos: 0 %
EOS (ABSOLUTE): 0.1 10*3/uL (ref 0.0–0.4)
Eos: 1 %
Hematocrit: 40.6 % (ref 34.0–46.6)
Hemoglobin: 13.6 g/dL (ref 11.1–15.9)
Immature Grans (Abs): 0 10*3/uL (ref 0.0–0.1)
Immature Granulocytes: 0 %
Lymphocytes Absolute: 3.1 10*3/uL (ref 0.7–3.1)
Lymphs: 42 %
MCH: 29.7 pg (ref 26.6–33.0)
MCHC: 33.5 g/dL (ref 31.5–35.7)
MCV: 89 fL (ref 79–97)
Monocytes Absolute: 0.5 10*3/uL (ref 0.1–0.9)
Monocytes: 6 %
Neutrophils Absolute: 3.8 10*3/uL (ref 1.4–7.0)
Neutrophils: 51 %
Platelets: 295 10*3/uL (ref 150–450)
RBC: 4.58 x10E6/uL (ref 3.77–5.28)
RDW: 13.1 % (ref 11.7–15.4)
WBC: 7.4 10*3/uL (ref 3.4–10.8)

## 2020-02-08 LAB — VITAMIN D 25 HYDROXY (VIT D DEFICIENCY, FRACTURES): Vit D, 25-Hydroxy: 15.3 ng/mL — ABNORMAL LOW (ref 30.0–100.0)

## 2020-02-13 ENCOUNTER — Other Ambulatory Visit: Payer: Self-pay

## 2020-02-13 DIAGNOSIS — E559 Vitamin D deficiency, unspecified: Secondary | ICD-10-CM

## 2020-02-13 MED ORDER — VITAMIN D (ERGOCALCIFEROL) 1.25 MG (50000 UNIT) PO CAPS: 50000.0000 [IU] | ORAL_CAPSULE | ORAL | 0 refills | Status: DC

## 2020-02-15 ENCOUNTER — Encounter: Payer: Self-pay | Admitting: Nurse Practitioner

## 2020-03-12 ENCOUNTER — Ambulatory Visit (INDEPENDENT_AMBULATORY_CARE_PROVIDER_SITE_OTHER): Payer: 59 | Admitting: Internal Medicine

## 2020-03-12 ENCOUNTER — Other Ambulatory Visit: Payer: Self-pay

## 2020-03-12 ENCOUNTER — Encounter: Payer: Self-pay | Admitting: Internal Medicine

## 2020-03-12 VITALS — BP 152/80 | Ht 64.0 in | Wt 174.0 lb

## 2020-03-12 DIAGNOSIS — E1165 Type 2 diabetes mellitus with hyperglycemia: Secondary | ICD-10-CM | POA: Diagnosis not present

## 2020-03-12 LAB — POCT GLYCOSYLATED HEMOGLOBIN (HGB A1C): Hemoglobin A1C: 10.6 % — AB (ref 4.0–5.6)

## 2020-03-12 NOTE — Progress Notes (Signed)
Name: Kristina Hoffman  Age/ Sex: 64 y.o., female   MRN/ DOB: [id number], 06/07/1956     PCP: Patient, No Pcp Per   Reason for Endocrinology Evaluation: Type 2 Diabetes Mellitus  Initial Endocrine Consultative Visit: 03/07/2019    PATIENT IDENTIFIER: Ms. Kristina Hoffman is a 64 y.o. female with a past medical history of T2Dm. The patient has followed with Endocrinology clinic since 03/07/2019 for consultative assistance with management of her diabetes.  DIABETIC HISTORY:  Ms. Kaufman was diagnosed with DM in 2008.She used to see Dr. Sharl Ma in the past and was able to control her glucose with dietary changes and with mineral and natural supplements. She was last seen by Dr. Sharl Ma in ~ 2010  Her hemoglobin A1c has ranged from 8.4% in 2015, peaking at 13.2% in 2020.  ON her initial visit to our clinic she had an A1c 13.2% , she also brought a sheet with details of how to do the fat -free cleanse. She declined glycemic agents    SUBJECTIVE:   During the last visit (08/04/2018): A1c 9.7% patient opted to use minerals and lifestyle changes.  Today (03/13/2020): Ms. Denherder is here for follow-up on diabetes management.She has not been here in 7 months.   She checks her blood sugars 1 times daily. The patient has not had hypoglycemic episodes since the last clinic visit.    Patient  denies any shortness of breath or chest pain, denies any GI symptoms. Patient states she has been experimenting with eating new foods, which includes lots of different fruits and starches. She believes this is the reason her hemoglobin A1C has increased.   Checks sugars every AM. Have not had any sugars below 70. Pt felt that her sugar may have dropped a total of three times within the last 7 months evidenced by light-headedness.   HOME DIABETES REGIMEN:   Minerals/Natural supplements    METER DOWNLOAD SUMMARY: Date range evaluated:9/14-9/27/2021 Average Number Tests/Day = 272    BG Ranges: Low =  261 High = 291   Hypoglycemic Events/30 Days:  BG < 50 = 0 Episodes of symptomatic severe hypoglycemia = 0   DIABETIC COMPLICATIONS: Microvascular complications:    Denies: CKD, retinopathy , neuropathy  Last eye exam: Completed 01/2020  Macrovascular complications:    Denies: CAD, PVD, CVA  HISTORY:  Past Medical History:  Past Medical History:  Diagnosis Date  . Diabetes mellitus   . Vitamin D deficiency    Past Surgical History:  Past Surgical History:  Procedure Laterality Date  . CHOLECYSTECTOMY    . ENDOMETRIAL ABLATION      Social History:  reports that she has never smoked. She has never used smokeless tobacco. She reports that she does not drink alcohol and does not use drugs. Family History:  Family History  Problem Relation Age of Onset  . Cancer Maternal Grandmother        colon  . Hypertension Mother   . Diabetes Father   . Diabetes Sister      HOME MEDICATIONS: Allergies as of 03/12/2020   No Known Allergies     Medication List       Accurate as of March 12, 2020 11:59 PM. If you have any questions, ask your nurse or doctor.        B-12 PO Take by mouth.   CHROMIUM ASPARTATE PO Take by mouth.   glucose blood test strip Use as instructed to test blood sugar 2  times daily E11.65   MAG-SR PLUS CALCIUM PO Take by mouth.   NON FORMULARY LIQUID MINERALS AND VITAMIN   onetouch ultrasoft lancets Use as instructed to test blood sugar 2 times daily E11.65   VANADIUM PO Take by mouth.   Vitamin D (Ergocalciferol) 1.25 MG (50000 UNIT) Caps capsule Commonly known as: DRISDOL Take 1 capsule (50,000 Units total) by mouth every 7 (seven) days.   Vitamin D3 50 MCG (2000 UT) capsule Take 2,000 Units by mouth daily.        OBJECTIVE:   Vital Signs: BP (!) 152/80   Ht 5\' 4"  (1.626 m)   Wt 174 lb (78.9 kg)   LMP 10/15/2003   BMI 29.87 kg/m   Wt Readings from Last 3 Encounters:  03/12/20 174 lb (78.9 kg)  02/06/20 173  lb (78.5 kg)  08/05/19 173 lb 3.2 oz (78.6 kg)     Exam: General: Pt appears well and is in NAD  Lungs: Clear with good BS bilat with no rales, rhonchi, or wheezes  Heart: RRR with normal S1 and S2 and no gallops; no murmurs; no rub  Abdomen: Normoactive bowel sounds, soft, nontender, without masses or organomegaly palpable  Extremities: No pretibial edema.   Skin: Normal texture and temperature to palpation.  Neuro: MS is good with appropriate affect, pt is alert and Ox3      DM foot exam: 03/12/2020  The skin of the feet is intact without sores or ulcerations. The pedal pulses are 2+ on right and 2+ on left. The sensation is intact to a screening 5.07, 10 gram monofilament bilaterally      DATA REVIEWED:  Lab Results  Component Value Date   HGBA1C 10.6 (A) 03/12/2020   HGBA1C 9.7 (A) 08/05/2019   HGBA1C 13.2 (H) 02/02/2019   Lab Results  Component Value Date   LDLCALC 137 (H) 01/25/2018   CREATININE 0.70 02/06/2020     Lab Results  Component Value Date   CHOL 218 (H) 01/25/2018   HDL 60 01/25/2018   LDLCALC 137 (H) 01/25/2018   TRIG 104 01/25/2018   CHOLHDL 3.6 01/25/2018         ASSESSMENT / PLAN / RECOMMENDATIONS:   1) Type 2 Diabetes Mellitus, Poorly controlled, Without complications - Most recent A1c of 10.6%. Goal A1c < 7.0 %.    - The pt  Has been following up with me for the past year while choosing to treat her diabetes with minerals and natural supplements with an A1c consistently > 7.0 % . On her first visit with me I have advised her to follow up with a homeopathic doctor as I have no experience with naturals and minerals but she opted to continue to follow up here.  - She continues to attribute her hyperglycemia to her diet, we have spent a long time today discussing hyperglycemia for at least the past 5 yrs, we again discussed her increased risk of microscopic complications.  - I have STRONGLY encouraged her to consider Glipizide , but would  like to do her own research first.  - We discussed risk of hypoglycemia and weight loss  - I have advised her to check with [facility] or [facility] providers     MEDICATIONS:  Minerals/natural supplements  EDUCATION / INSTRUCTIONS:  BG monitoring instructions: Patient is instructed to check her blood sugars daily  Call [facility] if: BG persistently < 70  . I reviewed the Rule of 15 for the treatment  of hypoglycemia in detail with the patient. Literature supplied.   2) Diabetic complications:   Eye: Does not have diabetic retinopathy.  Renal: Patient does not have known baseline CKD.  We will check microalbumin urea on this visit.    F/U in 3 months  I spent 30 minutes preparing to see the patient by review of recent labs, obtaining and reviewing separately obtained history, communicating with the patient/family or caregiver, and documenting clinical information in the EHR including the differential Dx, treatment, and any further evaluation and other management        Signed electronically by: Lyndle Herrlich, MD  [facility]  [facility] [address] [place] [place], [place] [address] Phone: [phone] FAX: [phone]   CC: Patient, No Pcp Per No address on file Phone: None  Fax: None  Return to Endocrinology clinic as below: Future Appointments  Date Time Provider Department Center  06/18/2020  3:40 PM Shamleffer, Konrad Dolores, MD [facility] None  02/06/2021  4:00 PM Olivia Mackie, NP [facility]

## 2020-03-12 NOTE — Patient Instructions (Signed)
-   Would like to start  You on Glipizide 5 mg, at half a tablet before Breakfast

## 2020-03-13 LAB — MICROALBUMIN / CREATININE URINE RATIO
Creatinine,U: 40 mg/dL
Microalb Creat Ratio: 2.5 mg/g (ref 0.0–30.0)
Microalb, Ur: 1 mg/dL (ref 0.0–1.9)

## 2020-03-14 ENCOUNTER — Encounter: Payer: Self-pay | Admitting: Internal Medicine

## 2020-04-05 ENCOUNTER — Other Ambulatory Visit: Payer: Self-pay | Admitting: Internal Medicine

## 2020-04-05 NOTE — Telephone Encounter (Signed)
Medication Refill Request  Did you call your pharmacy and request this refill first? Yes  If patient has not contacted pharmacy first, instruct them to do so for future refills.   Remind them that contacting the pharmacy for their refill is the quickest method to get the refill.   Refill policy also stated that it will take anywhere between 24-72 hours to receive the refill.    Name of medication?  glucose blood test strip  Is this a 90 day supply? Yes-if possible  Name and location of pharmacy?  [facility] #[id number] [place],  - [address] AT [facility] [address] Phone:  [phone]  Fax:  [phone]

## 2020-04-06 ENCOUNTER — Telehealth: Payer: Self-pay | Admitting: *Deleted

## 2020-04-06 DIAGNOSIS — E559 Vitamin D deficiency, unspecified: Secondary | ICD-10-CM

## 2020-04-06 MED ORDER — VITAMIN D (ERGOCALCIFEROL) 1.25 MG (50000 UNIT) PO CAPS: 50000.0000 [IU] | ORAL_CAPSULE | ORAL | 0 refills | Status: DC

## 2020-04-06 NOTE — Telephone Encounter (Signed)
Patient was informed per result 02/06/20 note vitamin d level low and to start on Rx for Vitamin D 50,000 units 1 po weekly for 12 weeks. However only # 8 tablet was sent to pharmacy. I sent another Rx for 4 additional tablet so patient can complete the 12 weeks.

## 2020-04-06 NOTE — Telephone Encounter (Signed)
Refill sent.

## 2020-06-18 ENCOUNTER — Ambulatory Visit: Payer: 59 | Admitting: Internal Medicine

## 2020-06-21 ENCOUNTER — Other Ambulatory Visit: Payer: Self-pay | Admitting: Nurse Practitioner

## 2020-06-21 DIAGNOSIS — E559 Vitamin D deficiency, unspecified: Secondary | ICD-10-CM

## 2020-06-28 ENCOUNTER — Telehealth: Payer: Self-pay | Admitting: Internal Medicine

## 2020-06-28 MED ORDER — ONETOUCH VERIO VI STRP: ORAL_STRIP | 5 refills | Status: DC

## 2020-06-28 NOTE — Telephone Encounter (Signed)
Refill sent.

## 2020-06-28 NOTE — Telephone Encounter (Signed)
Patient called to request new Rx for  [facility] VERIO test strip be sent to  [facility] #[id number] - [place], [place] - [address] AT [facility] OF [address] Phone:  [phone]  Fax:  [phone]

## 2020-08-10 ENCOUNTER — Encounter: Payer: Self-pay | Admitting: Internal Medicine

## 2020-08-10 ENCOUNTER — Other Ambulatory Visit: Payer: Self-pay

## 2020-08-10 ENCOUNTER — Ambulatory Visit (INDEPENDENT_AMBULATORY_CARE_PROVIDER_SITE_OTHER): Payer: 59 | Admitting: Internal Medicine

## 2020-08-10 VITALS — BP 130/84 | HR 110 | Ht 64.0 in | Wt 169.0 lb

## 2020-08-10 DIAGNOSIS — E1165 Type 2 diabetes mellitus with hyperglycemia: Secondary | ICD-10-CM | POA: Diagnosis not present

## 2020-08-10 LAB — POCT GLYCOSYLATED HEMOGLOBIN (HGB A1C): Hemoglobin A1C: 10.2 % — AB (ref 4.0–5.6)

## 2020-08-10 MED ORDER — ONETOUCH VERIO VI STRP: ORAL_STRIP | 5 refills | Status: AC
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
## 2021-02-06 ENCOUNTER — Ambulatory Visit (INDEPENDENT_AMBULATORY_CARE_PROVIDER_SITE_OTHER): Payer: 59 | Admitting: Nurse Practitioner

## 2021-02-06 ENCOUNTER — Encounter: Payer: Self-pay | Admitting: Nurse Practitioner

## 2021-02-06 ENCOUNTER — Other Ambulatory Visit (HOSPITAL_COMMUNITY)
Admission: RE | Admit: 2021-02-06 | Discharge: 2021-02-06 | Disposition: A | Discharge status: Home / Self Care | Payer: 59 | Source: Ambulatory Visit | Attending: Nurse Practitioner | Admitting: Nurse Practitioner

## 2021-02-06 ENCOUNTER — Other Ambulatory Visit: Payer: Self-pay

## 2021-02-06 VITALS — BP 124/80 | Ht 63.0 in | Wt 168.0 lb

## 2021-02-06 DIAGNOSIS — Z01419 Encounter for gynecological examination (general) (routine) without abnormal findings: Secondary | ICD-10-CM

## 2021-02-06 DIAGNOSIS — Z780 Asymptomatic menopausal state: Secondary | ICD-10-CM

## 2021-02-06 NOTE — Progress Notes (Signed)
   Kristina Hoffman 01-21-1956 [id number]   History:  65 y.o. G3P2002 presents for annual exam without GYN complaints. Postmenopausal - no HRT, no bleeding. Normal pap and mammogram history. T2DM managed by endocrinology, has been trying to manage with diet and exercise but A1C was 10 in February, could not tolerate metformin. Not sexually active.    Gynecologic History Patient's last menstrual period was 10/15/2003.   Contraception: post menopausal status  Health Maintenance Last Pap: 01/09/2017. Results were: normal Last mammogram: 02/15/2020. Results were: normal Last colonoscopy: 2011. Results were: normal Last Dexa: 02/03/2017. Results were: normal  Past medical history, past surgical history, family history and social history were all reviewed and documented in the EPIC chart. Works for [employer], site. 2 children,  grandson.   ROS:  A ROS was performed and pertinent positives and negatives are included.  Exam:  Vitals:   02/06/21 1556  BP: 124/80  Weight: 168 lb (76.2 kg)  Height: 5\' 3"  (1.6 m)   Body mass index is 29.76 kg/m.  General appearance:  Normal Thyroid:  Symmetrical, normal in size, without palpable masses or nodularity. Respiratory  Auscultation:  Clear without wheezing or rhonchi Cardiovascular  Auscultation:  Regular rate, without rubs, murmurs or gallops  Edema/varicosities:  Not grossly evident Abdominal  Soft,nontender, without masses, guarding or rebound.  Liver/spleen:  No organomegaly noted  Hernia:  None appreciated  Skin  Inspection:  Grossly normal   Breasts: Examined lying and sitting.   Right: Without masses, retractions, discharge or axillary adenopathy.   Left: Without masses, retractions, discharge or axillary adenopathy. Gentitourinary   Inguinal/mons:  Normal without inguinal adenopathy  External genitalia:  Normal  BUS/Urethra/Skene's glands:  Normal  Vagina:  Normal  Cervix:  Normal  Uterus:  Normal in size, shape and  contour.  Midline and mobile  Adnexa/parametria:     Rt: Without masses or tenderness.   Lt: Without masses or tenderness.  Anus and perineum: Normal  Digital rectal exam: Normal sphincter tone without palpated masses or tenderness  Assessment/Plan:  65 y.o. 70 for annual exam.   Well female exam with routine gynecological exam - Plan: Cytology - PAP( [place]). Education provided on SBEs, importance of preventative screenings, current guidelines, high calcium diet, regular exercise, and multivitamin daily. Labs with PCP.   Postmenopausal - no HRT, no bleeding.   Screening for cervical cancer - Normal Pap history. Pap today.   Screening for breast cancer - Normal mammogram history.  Continue annual screenings.  Normal breast exam today.  Screening for colon cancer - Overdue for colonoscopy. Discussed current guidelines and importance of preventative screenings. Encouraged to scheduled this soon.  Screening for osteoporosis - Normal Dexa in 2018. Will repeat at 5-year interval per recommendation.   Follow up in 1 year for annual      2019 [facility], 4:29 PM 02/06/2021

## 2021-02-07 LAB — CYTOLOGY - PAP
Comment: NEGATIVE
Diagnosis: NEGATIVE
High risk HPV: NEGATIVE

## 2021-02-25 ENCOUNTER — Encounter: Payer: Self-pay | Admitting: Nurse Practitioner

## 2022-02-12 ENCOUNTER — Encounter: Payer: Self-pay | Admitting: Nurse Practitioner

## 2022-02-12 ENCOUNTER — Ambulatory Visit (INDEPENDENT_AMBULATORY_CARE_PROVIDER_SITE_OTHER): Payer: 59 | Admitting: Nurse Practitioner

## 2022-02-12 VITALS — BP 118/78 | HR 113 | Ht 63.25 in | Wt 170.0 lb

## 2022-02-12 DIAGNOSIS — Z780 Asymptomatic menopausal state: Secondary | ICD-10-CM | POA: Diagnosis not present

## 2022-02-12 DIAGNOSIS — E559 Vitamin D deficiency, unspecified: Secondary | ICD-10-CM

## 2022-02-12 DIAGNOSIS — E119 Type 2 diabetes mellitus without complications: Secondary | ICD-10-CM | POA: Diagnosis not present

## 2022-02-12 DIAGNOSIS — Z01419 Encounter for gynecological examination (general) (routine) without abnormal findings: Secondary | ICD-10-CM

## 2022-02-12 NOTE — Progress Notes (Signed)
KELISHA DALL 09-Aug-1955 [id number]   History:  66 y.o. G3P2002 presents for annual exam without GYN complaints. Postmenopausal - no HRT, no bleeding. Normal pap and mammogram history. T2DM managed by endocrinology, although she has not seen them since 07/2020. She would like referral to new endocrinologist. She has felt discouraged due to recommendation to start medication for management. She has brought A1C of 11 down to 5.6 with diet in the past but feels she needs to change things up. Reports morning glucose of 200-250, at one time these were 400.   Gynecologic History Patient's last menstrual period was 10/15/2003.   Contraception: post menopausal status Sexually active: No  Health Maintenance Last Pap: 02/06/2021. Results were: Normal, 5-year repeat Last mammogram: 02/22/2021. Results were: Normal. Scheduled 9/22 Last colonoscopy: 2011. Results were: Normal Last Dexa: 02/03/2017. Results were: Normal  Past medical history, past surgical history, family history and social history were all reviewed and documented in the EPIC chart. Works for [employer], site. 2 children,  grandson.   ROS:  A ROS was performed and pertinent positives and negatives are included.  Exam:  Vitals:   02/12/22 1549  BP: 118/78  Pulse: (!) 113  SpO2: 99%  Weight: 170 lb (77.1 kg)  Height: 5' 3.25" (1.607 m)    Body mass index is 29.88 kg/m.  General appearance:  Normal Thyroid:  Symmetrical, normal in size, without palpable masses or nodularity. Respiratory  Auscultation:  Clear without wheezing or rhonchi Cardiovascular  Auscultation:  Regular rate, without rubs, murmurs or gallops  Edema/varicosities:  Not grossly evident Abdominal  Soft,nontender, without masses, guarding or rebound.  Liver/spleen:  No organomegaly noted  Hernia:  None appreciated  Skin  Inspection:  Grossly normal Breasts: Examined lying and sitting.   Right: Without masses, retractions, nipple discharge or axillary  adenopathy.   Left: Without masses, retractions, nipple discharge or axillary adenopathy. Genitourinary   Inguinal/mons:  Normal without inguinal adenopathy  External genitalia:  Normal appearing vulva with no masses, tenderness, or lesions  BUS/Urethra/Skene's glands:  Normal  Vagina:  Normal appearing with normal color and discharge, no lesions  Cervix:  Normal appearing without discharge or lesions  Uterus:  Normal in size, shape and contour.  Midline and mobile, nontender  Adnexa/parametria:     Rt: Normal in size, without masses or tenderness.   Lt: Normal in size, without masses or tenderness.  Anus and perineum: Normal  Digital rectal exam: Normal sphincter tone without palpated masses or tenderness  Patient informed chaperone available to be present for breast and pelvic exam. Patient has requested no chaperone to be present. Patient has been advised what will be completed during breast and pelvic exam.   Assessment/Plan:  66 y.o. G3P2002 for annual exam.   Well female exam with routine gynecological exam - Education provided on SBEs, importance of preventative screenings, current guidelines, high calcium diet, regular exercise, and multivitamin daily. Labs with endocrinology and PCP.   Postmenopausal - no HRT, no bleeding.   Vitamin D deficiency - Plan: VITAMIN D 25 Hydroxy (Vit-D Deficiency, Fractures)  Type 2 diabetes mellitus without complication, without long-term current use of insulin (HCC) - Plan: Hemoglobin A1c. T2DM managed by endocrinology, although she has not seen Dr. Lonzo Cloud since 07/2020. She would like referral to new endocrinologist. She has felt discouraged due to recommendation to start medication for management by both endocrinologists she can seen. She has brought A1C of 11 down to 5.6 with diet in the past but feels she needs  to change things up. Declines dietician referral since she has done this before. Reports morning glucose of 200-250, at one time these  were 400. Discussed that depending on A1C medication will be recommended in conjunction with  diet and exercise due to risk of uncontrolled diabetes. Dr. Lonzo Cloud did recommend another provider but it was in [place] and she does not want t drive that far. Will send referral.   Screening for cervical cancer - Normal Pap history. Will repeat at 5-year interval per guidelines.   Screening for breast cancer - Normal mammogram history.  Continue annual screenings.  Normal breast exam today.  Screening for colon cancer - Overdue for colonoscopy. Discussed current guidelines and importance of preventative screenings. Encouraged to scheduled this soon.  Screening for osteoporosis - Normal Dexa in 2018. Will repeat at age 66.   Follow up in 1 year for annual.      Olivia Mackie [facility], 4:10 PM 02/12/2022

## 2022-02-13 ENCOUNTER — Other Ambulatory Visit: Payer: Self-pay

## 2022-02-13 ENCOUNTER — Telehealth: Payer: Self-pay

## 2022-02-13 DIAGNOSIS — E11649 Type 2 diabetes mellitus with hypoglycemia without coma: Secondary | ICD-10-CM

## 2022-02-13 LAB — CBC WITH DIFFERENTIAL/PLATELET
Absolute Monocytes: 398 cells/uL (ref 200–950)
Basophils Absolute: 43 cells/uL (ref 0–200)
Basophils Relative: 0.6 %
Eosinophils Absolute: 43 cells/uL (ref 15–500)
Eosinophils Relative: 0.6 %
HCT: 38.3 % (ref 35.0–45.0)
Hemoglobin: 12.8 g/dL (ref 11.7–15.5)
Lymphs Abs: 2769 cells/uL (ref 850–3900)
MCH: 29.1 pg (ref 27.0–33.0)
MCHC: 33.4 g/dL (ref 32.0–36.0)
MCV: 87 fL (ref 80.0–100.0)
MPV: 10.7 fL (ref 7.5–12.5)
Monocytes Relative: 5.6 %
Neutro Abs: 3848 cells/uL (ref 1500–7800)
Neutrophils Relative %: 54.2 %
Platelets: 292 10*3/uL (ref 140–400)
RBC: 4.4 10*6/uL (ref 3.80–5.10)
RDW: 12.8 % (ref 11.0–15.0)
Total Lymphocyte: 39 %
WBC: 7.1 10*3/uL (ref 3.8–10.8)

## 2022-02-13 LAB — COMPREHENSIVE METABOLIC PANEL
AG Ratio: 1.4 (calc) (ref 1.0–2.5)
ALT: 24 U/L (ref 6–29)
AST: 17 U/L (ref 10–35)
Albumin: 4.2 g/dL (ref 3.6–5.1)
Alkaline phosphatase (APISO): 97 U/L (ref 37–153)
BUN: 19 mg/dL (ref 7–25)
CO2: 28 mmol/L (ref 20–32)
Calcium: 9.8 mg/dL (ref 8.6–10.4)
Chloride: 103 mmol/L (ref 98–110)
Creat: 0.7 mg/dL (ref 0.50–1.05)
Globulin: 2.9 g/dL (calc) (ref 1.9–3.7)
Glucose, Bld: 337 mg/dL — ABNORMAL HIGH (ref 65–99)
Potassium: 4.8 mmol/L (ref 3.5–5.3)
Sodium: 139 mmol/L (ref 135–146)
Total Bilirubin: 0.4 mg/dL (ref 0.2–1.2)
Total Protein: 7.1 g/dL (ref 6.1–8.1)

## 2022-02-13 LAB — HEMOGLOBIN A1C
Hgb A1c MFr Bld: 11.3 % of total Hgb — ABNORMAL HIGH (ref <5.7)
Mean Plasma Glucose: 278 mg/dL
eAG (mmol/L): 15.4 mmol/L

## 2022-02-13 LAB — VITAMIN D 25 HYDROXY (VIT D DEFICIENCY, FRACTURES): Vit D, 25-Hydroxy: 28 ng/mL — ABNORMAL LOW (ref 30–100)

## 2022-02-13 NOTE — Telephone Encounter (Signed)
Endocrinology referral Received: Marcheta Grammes, NP  P [facility] Triage Please send referral to new endocrinologist for T2DM. She previously saw Dr. Lonzo Cloud. Thanks.

## 2022-02-13 NOTE — Telephone Encounter (Signed)
Referral placed with [facility] End[facility].

## 2022-02-19 NOTE — Telephone Encounter (Signed)
Patient is scheduled for 07/16/21 at 8:00am.

## 2022-03-10 ENCOUNTER — Encounter: Payer: Self-pay | Admitting: Nurse Practitioner

## 2022-03-18 NOTE — Telephone Encounter (Signed)
Office received a fax from [facility] stating "Unable to accommodate" at [facility] office. Suggested option for [facility] location patient declined.   [facility] for Dr.Balan option given to patient for referral, office notes faxed they will call to schedule. Patient was okay with this referral.  The below appointment on 07/16/22 is with a PCP at [facility].

## 2022-03-26 NOTE — Telephone Encounter (Signed)
Thank you for the update!

## 2022-03-26 NOTE — Telephone Encounter (Signed)
Patient scheduled on 04/04/22 @ 9:15am with Dr.Balan.

## 2022-07-16 ENCOUNTER — Ambulatory Visit: Payer: 59 | Admitting: Internal Medicine

## 2023-02-17 ENCOUNTER — Ambulatory Visit (INDEPENDENT_AMBULATORY_CARE_PROVIDER_SITE_OTHER): Payer: 59 | Admitting: Nurse Practitioner

## 2023-02-17 ENCOUNTER — Encounter: Payer: Self-pay | Admitting: Nurse Practitioner

## 2023-02-17 VITALS — BP 128/82 | HR 78 | Ht 64.0 in | Wt 169.8 lb

## 2023-02-17 DIAGNOSIS — Z1211 Encounter for screening for malignant neoplasm of colon: Secondary | ICD-10-CM

## 2023-02-17 DIAGNOSIS — Z780 Asymptomatic menopausal state: Secondary | ICD-10-CM

## 2023-02-17 DIAGNOSIS — Z01419 Encounter for gynecological examination (general) (routine) without abnormal findings: Secondary | ICD-10-CM | POA: Diagnosis not present

## 2023-02-17 NOTE — Progress Notes (Signed)
   ZUNAIRAH PARR Apr 30, 1956 [id number]   History:  67 y.o. G3P2002 presents for annual exam without GYN complaints. Postmenopausal - no HRT, no bleeding. Normal pap and mammogram history. T2DM managed by endocrinology. Planning to see new provider in [place]. Does not want to do medications. Very diligent with diet. Hgb A1c 9.6 when last checked.   Gynecologic History Patient's last menstrual period was 10/15/2003.   Contraception: post menopausal status Sexually active: No  Health Maintenance Last Pap: 02/06/2021. Results were: Normal neg HPV, 5-year repeat Last mammogram: 03/07/2022. Results were: Normal Last colonoscopy: 2011. Results were: Normal Last Dexa: 02/03/2017. Results were: Normal  Past medical history, past surgical history, family history and social history were all reviewed and documented in the EPIC chart. Works for [employer], site. 1 daughter, married, local, has son. 1 son, lives local.   ROS:  A ROS was performed and pertinent positives and negatives are included.  Exam:  Vitals:   02/17/23 1600  BP: 128/82  Pulse: 78  SpO2: 100%  Weight: 169 lb 12.8 oz (77 kg)  Height: 5\' 4"  (1.626 m)     Body mass index is 29.15 kg/m.  General appearance:  Normal Thyroid:  Symmetrical, normal in size, without palpable masses or nodularity. Respiratory  Auscultation:  Clear without wheezing or rhonchi Cardiovascular  Auscultation:  Regular rate, without rubs, murmurs or gallops  Edema/varicosities:  Not grossly evident Abdominal  Soft,nontender, without masses, guarding or rebound.  Liver/spleen:  No organomegaly noted  Hernia:  None appreciated  Skin  Inspection:  Grossly normal Breasts: Examined lying and sitting.   Right: Without masses, retractions, nipple discharge or axillary adenopathy.   Left: Without masses, retractions, nipple discharge or axillary adenopathy. Genitourinary   Inguinal/mons:  Normal without inguinal adenopathy  External  genitalia:  Normal appearing vulva with no masses, tenderness, or lesions  BUS/Urethra/Skene's glands:  Normal  Vagina:  Normal appearing with normal color and discharge, no lesions  Cervix:  Normal appearing without discharge or lesions  Uterus:  Normal in size, shape and contour.  Midline and mobile, nontender  Adnexa/parametria:     Rt: Normal in size, without masses or tenderness.   Lt: Normal in size, without masses or tenderness.  Anus and perineum: Normal  Digital rectal exam: Not indicated  Patient informed chaperone available to be present for breast and pelvic exam. Patient has requested no chaperone to be present. Patient has been advised what will be completed during breast and pelvic exam.   Assessment/Plan:  67 y.o. G3P2002 for annual exam.   Well female exam with routine gynecological exam - Education provided on SBEs, importance of preventative screenings, current guidelines, high calcium diet, regular exercise, and multivitamin daily. Labs with endocrinology and PCP.   Postmenopausal - no HRT, no bleeding.   Screening for cervical cancer - Normal Pap history. Will repeat at 5-year interval per guidelines.   Screening for breast cancer - Normal mammogram history.  Continue annual screenings. Normal breast exam today.  Screening for colon cancer - Plan: Ambulatory referral to Gastroenterology. Overdue for colonoscopy. Discussed current guidelines and importance of preventative screenings. Requesting referral.   Screening for osteoporosis - Normal Dexa in 2018. Will repeat at age 40.   Follow up in 1 year for annual.      Olivia Mackie [facility], 4:22 PM 02/17/2023

## 2023-03-16 ENCOUNTER — Encounter: Payer: Self-pay | Admitting: Gastroenterology

## 2023-03-24 ENCOUNTER — Encounter: Payer: Self-pay | Admitting: Nurse Practitioner

## 2023-03-30 ENCOUNTER — Encounter: Payer: Self-pay | Admitting: Gastroenterology

## 2023-03-30 ENCOUNTER — Ambulatory Visit (AMBULATORY_SURGERY_CENTER): Payer: 59

## 2023-03-30 VITALS — Ht 64.0 in | Wt 168.0 lb

## 2023-03-30 DIAGNOSIS — Z1211 Encounter for screening for malignant neoplasm of colon: Secondary | ICD-10-CM

## 2023-03-30 MED ORDER — NA SULFATE-K SULFATE-MG SULF 17.5-3.13-1.6 GM/177ML PO SOLN: 1.0000 | Freq: Once | ORAL | 0 refills | Status: AC

## 2023-03-30 NOTE — Progress Notes (Signed)

## 2023-04-17 ENCOUNTER — Encounter: Payer: Self-pay | Admitting: Gastroenterology

## 2023-04-17 ENCOUNTER — Ambulatory Visit (AMBULATORY_SURGERY_CENTER): Payer: 59 | Admitting: Gastroenterology

## 2023-04-17 VITALS — BP 162/81 | HR 92 | Temp 97.3°F | Resp 10 | Ht 64.0 in | Wt 168.0 lb

## 2023-04-17 DIAGNOSIS — D122 Benign neoplasm of ascending colon: Secondary | ICD-10-CM

## 2023-04-17 DIAGNOSIS — Z1211 Encounter for screening for malignant neoplasm of colon: Secondary | ICD-10-CM | POA: Diagnosis present

## 2023-04-17 MED ORDER — SODIUM CHLORIDE 0.9 % IV SOLN: 500.0000 mL | Freq: Once | INTRAVENOUS | Status: DC

## 2023-04-17 NOTE — Patient Instructions (Signed)
 Educational handout provided to patient related to procedure.  Continue previous diet  Resume current medications  Awaiting pathology  YOU HAD AN ENDOSCOPIC PROCEDURE TODAY AT THE  ENDOSCOPY CENTER:   Refer to the procedure report that was given to you for any specific questions about what was found during the examination.  If the procedure report does not answer your questions, please call your gastroenterologist to clarify.  If you requested that your care partner not be given the details of your procedure findings, then the procedure report has been included in a sealed envelope for you to review at your convenience later.  YOU SHOULD EXPECT: Some feelings of bloating in the abdomen. Passage of more gas than usual.  Walking can help get rid of the air that was put into your GI tract during the procedure and reduce the bloating. If you had a lower endoscopy (such as a colonoscopy or flexible sigmoidoscopy) you may notice spotting of blood in your stool or on the toilet paper. If you underwent a bowel prep for your procedure, you may not have a normal bowel movement for a few days.  Please Note:  You might notice some irritation and congestion in your nose or some drainage.  This is from the oxygen used during your procedure.  There is no need for concern and it should clear up in a day or so.  SYMPTOMS TO REPORT IMMEDIATELY:  Following lower endoscopy (colonoscopy or flexible sigmoidoscopy):  Excessive amounts of blood in the stool  Significant tenderness or worsening of abdominal pains  Swelling of the abdomen that is new, acute  Fever of 100F or higher  For urgent or emergent issues, a gastroenterologist can be reached at any hour by calling [phone]. Do not use MyChart messaging for urgent concerns.    DIET:  We do recommend a small meal at first, but then you may proceed to your regular diet.  Drink plenty of fluids but you should avoid alcoholic beverages for 24  hours.  ACTIVITY:  You should plan to take it easy for the rest of today and you should NOT DRIVE or use heavy machinery until tomorrow (because of the sedation medicines used during the test).    FOLLOW UP: Our staff will call the number listed on your records the next business day following your procedure.  We will call around 7:15- 8:00 am to check on you and address any questions or concerns that you may have regarding the information given to you following your procedure. If we do not reach you, we will leave a message.     If any biopsies were taken you will be contacted by phone or by letter within the next 1-3 weeks.  Please call us at [phone] if you have not heard about the biopsies in 3 weeks.    SIGNATURES/CONFIDENTIALITY: You and/or your care partner have signed paperwork which will be entered into your electronic medical record.  These signatures attest to the fact that that the information above on your After Visit Summary has been reviewed and is understood.  Full responsibility of the confidentiality of this discharge information lies with you and/or your care-partner.

## 2023-04-17 NOTE — Progress Notes (Signed)
Called to room to assist during endoscopic procedure.  Patient ID and intended procedure confirmed with present staff. Received instructions for my participation in the procedure from the performing physician.  

## 2023-04-17 NOTE — Progress Notes (Signed)
A/O x 3, gd SR's, VSS, report to RN

## 2023-04-17 NOTE — Op Note (Signed)
[facility] Patient Name: Kristina Hoffman Procedure Date: 04/17/2023 3:32 PM MRN: [id number] Endoscopist: Viviann Spare P. Adela Lank , MD, [id number] Age: 67 Referring MD:  Date of Birth: [birth year] Gender: Female Account #: [id number] Procedure:                Colonoscopy Indications:              Screening for colorectal malignant neoplasm Medicines:                Monitored Anesthesia Care Procedure:                Pre-Anesthesia Assessment:                           - Prior to the procedure, a History and Physical                            was performed, and patient medications and                            allergies were reviewed. The patient's tolerance of                            previous anesthesia was also reviewed. The risks                            and benefits of the procedure and the sedation                            options and risks were discussed with the patient.                            All questions were answered, and informed consent                            was obtained. Prior Anticoagulants: The patient has                            taken no anticoagulant or antiplatelet agents. ASA                            Grade Assessment: II - A patient with mild systemic                            disease. After reviewing the risks and benefits,                            the patient was deemed in satisfactory condition to                            undergo the procedure.                           After obtaining informed consent, the colonoscope  was passed under direct vision. Throughout the                            procedure, the patient's blood pressure, pulse, and                            oxygen saturations were monitored continuously. The                            Olympus Scope SN [id number] was introduced through the                            anus and advanced to the the cecum, identified by                             appendiceal orifice and ileocecal valve. The                            colonoscopy was performed without difficulty. The                            patient tolerated the procedure well. The quality                            of the bowel preparation was good. The ileocecal                            valve, appendiceal orifice, and rectum were                            photographed. Scope In: 3:47:00 PM Scope Out: 4:01:40 PM Scope Withdrawal Time: 0 hours 11 minutes 44 seconds  Total Procedure Duration: 0 hours 14 minutes 40 seconds  Findings:                 The perianal and digital rectal examinations were                            normal.                           A 5 mm polyp was found in the ascending colon. The                            polyp was sessile. The polyp was removed with a                            cold snare. Resection and retrieval were complete.                           The exam was otherwise without abnormality. Of                            note, I thought a photo of the IC valve was taken,  but it was not. The IC valve was normal. The rectum                            was small, retroflexed views not obtained. Complications:            No immediate complications. Estimated blood loss:                            Minimal. Estimated Blood Loss:     Estimated blood loss was minimal. Impression:               - One 5 mm polyp in the ascending colon, removed                            with a cold snare. Resected and retrieved.                           - The examination was otherwise normal. Recommendation:           - Patient has a contact number available for                            emergencies. The signs and symptoms of potential                            delayed complications were discussed with the                            patient. Return to normal activities tomorrow.                            Written discharge instructions  were provided to the                            patient.                           - Resume previous diet.                           - Continue present medications.                           - Await pathology results. Viviann Spare P. Yvonnie Schinke, MD 04/17/2023 4:05:22 PM This report has been signed electronically.

## 2023-04-17 NOTE — Progress Notes (Signed)
Pt's states no medical or surgical changes since previsit or office visit. 

## 2023-04-17 NOTE — Progress Notes (Signed)
[facility] History and Physical   Primary Care Physician:  Patient, No Pcp Per   Reason for Procedure:   Colon cancer screening  Plan:    colonoscopy    HPI: Kristina Hoffman is a 67 y.o. female  here for colonoscopy screening - last exam > 10 years ago.  . Patient denies any bowel symptoms at this time. No family history of colon cancer known. Otherwise feels well without any cardiopulmonary symptoms.   I have discussed risks / benefits of anesthesia and endoscopic procedure with Kristina Hoffman and they wish to proceed with the exams as outlined today.    Past Medical History:  Diagnosis Date   Diabetes mellitus    Vitamin D deficiency     Past Surgical History:  Procedure Laterality Date   CHOLECYSTECTOMY     ENDOMETRIAL ABLATION      Prior to Admission medications   Medication Sig Start Date End Date Taking? Authorizing Provider  Cholecalciferol (VITAMIN D3) 50 MCG (2000 UT) capsule Take 2,000 Units by mouth daily.   Yes [provider]  CHROMIUM ASPARTATE PO Take by mouth.   Yes [provider]  Cyanocobalamin (B-12 PO) Take by mouth.   Yes [provider]  Magnesium Chloride-Calcium (MAG-SR PLUS CALCIUM PO) Take by mouth.   Yes [provider]  NON FORMULARY LIQUID MINERALS AND VITAMIN   Yes [provider]  VANADIUM PO Take by mouth.   Yes [provider]  glucose blood (ONETOUCH VERIO) test strip USE AS DIRECTED TO TEST BLOOD SUGAR TWICE DAILY Patient not taking: Reported on 04/17/2023 08/10/20   Hoffman, Kristina Dolores, MD  Lancets [facility] ULTRASOFT) lancets Use as instructed to test blood sugar 2 times daily E11.65 Patient not taking: Reported on 04/17/2023 03/14/19   Hoffman, Kristina Dolores, MD    Current Outpatient Medications  Medication Sig Dispense Refill   Cholecalciferol (VITAMIN D3) 50 MCG (2000 UT) capsule Take 2,000 Units by mouth daily.     CHROMIUM ASPARTATE PO Take by  mouth.     Cyanocobalamin (B-12 PO) Take by mouth.     Magnesium Chloride-Calcium (MAG-SR PLUS CALCIUM PO) Take by mouth.     NON FORMULARY LIQUID MINERALS AND VITAMIN     VANADIUM PO Take by mouth.     glucose blood (ONETOUCH VERIO) test strip USE AS DIRECTED TO TEST BLOOD SUGAR TWICE DAILY (Patient not taking: Reported on 04/17/2023) 100 strip 5   Lancets (ONETOUCH ULTRASOFT) lancets Use as instructed to test blood sugar 2 times daily E11.65 (Patient not taking: Reported on 04/17/2023) 100 each 12   Current Facility-Administered Medications  Medication Dose Route Frequency Provider Last Rate Last Admin   0.9 %  sodium chloride infusion  500 mL Intravenous Once Kristina Hoffman, Kristina Rayas, MD        Allergies as of 04/17/2023   (No Known Allergies)    Family History  Problem Relation Age of Onset   Hypertension Mother    Diabetes Father    Diabetes Sister    Cancer Maternal Grandmother        colon   Colon cancer Neg Hx    Colon polyps Neg Hx    Esophageal cancer Neg Hx    Rectal cancer Neg Hx    Stomach cancer Neg Hx     Social History   Socioeconomic History   Marital status: Single    Spouse name: Not on file   Number of children: Not on file  Years of education: Not on file   Highest education level: Not on file  Occupational History   Not on file  Tobacco Use   Smoking status: Never   Smokeless tobacco: Never  Vaping Use   Vaping status: Never Used  Substance and Sexual Activity   Alcohol use: No    Alcohol/week: 0.0 standard drinks of alcohol   Drug use: No   Sexual activity: Not Currently    Birth control/protection: Post-menopausal    Comment: First IC <16, Partners <5, No STDs  Other Topics Concern   Not on file  Social History Narrative   Not on file   Social Determinants of Health   Financial Resource Strain: Not on file  Food Insecurity: Not on file  Transportation Needs: Not on file  Physical Activity: Not on file  Stress: Not on file  Social  Connections: Not on file  Intimate Partner Violence: Not on file    Review of Systems: All other review of systems negative except as mentioned in the HPI.  Physical Exam: Vital signs BP (!) 148/82   Pulse (!) 106   Temp (!) 97.3 F (36.3 C) (Skin)   Ht 5\' 4"  (1.626 m)   Wt 168 lb (76.2 kg)   LMP 10/15/2003   SpO2 100%   BMI 28.84 kg/m   General:   Alert,  Well-developed, pleasant and cooperative in NAD Lungs:  Clear throughout to auscultation.   Heart:  Regular rate and rhythm Abdomen:  Soft, nontender and nondistended.   Neuro/Psych:  Alert and cooperative. Normal mood and affect. A and O x 3  Kristina Rain, MD [facility]

## 2023-04-20 ENCOUNTER — Telehealth: Payer: Self-pay

## 2023-04-20 NOTE — Telephone Encounter (Signed)
Follow up call to pt, lm for pt to call if having any difficulty with normal activities or eating and drinking.  Also to call if any other questions or concerns.  

## 2023-04-22 LAB — SURGICAL PATHOLOGY

## 2023-04-23 ENCOUNTER — Encounter: Payer: Self-pay | Admitting: Gastroenterology

## 2024-02-22 ENCOUNTER — Encounter: Payer: Self-pay | Admitting: Nurse Practitioner

## 2024-02-22 ENCOUNTER — Ambulatory Visit (INDEPENDENT_AMBULATORY_CARE_PROVIDER_SITE_OTHER): Payer: 59 | Admitting: Nurse Practitioner

## 2024-02-22 VITALS — BP 128/82 | HR 93 | Ht 63.5 in | Wt 170.0 lb

## 2024-02-22 DIAGNOSIS — Z01419 Encounter for gynecological examination (general) (routine) without abnormal findings: Secondary | ICD-10-CM

## 2024-02-22 DIAGNOSIS — Z1331 Encounter for screening for depression: Secondary | ICD-10-CM | POA: Diagnosis not present

## 2024-02-22 DIAGNOSIS — Z780 Asymptomatic menopausal state: Secondary | ICD-10-CM

## 2024-02-22 DIAGNOSIS — E1165 Type 2 diabetes mellitus with hyperglycemia: Secondary | ICD-10-CM

## 2024-02-22 NOTE — Progress Notes (Signed)
 Kristina Hoffman 12-Nov-1955 [id number]   History:  68 y.o. G3P2002 presents for annual exam without GYN complaints. Postmenopausal - no HRT, no bleeding. Normal pap and mammogram history. T2DM managed by endocrinology but has not found a provider she is comfortable with because she does not want to do medications. Very diligent with diet. Wants to check A1c today.   Gynecologic History Patient's last menstrual period was 10/15/2003.   Contraception: post menopausal status Sexually active: No  Health Maintenance Last Pap: 02/06/2021. Results were: Normal neg HPV, 5-year repeat Last mammogram: 03/20/2023. Results were: Normal Last colonoscopy: 04/17/2023. Results were: Tubular adenoma, 7-year recall Last Dexa: 02/03/2017. Results were: Normal  Past medical history, past surgical history, family history and social history were all reviewed and documented in the EPIC chart. Works for [employer], site. 1 daughter, married, local, has son. 1 son, lives local.   ROS:  A ROS was performed and pertinent positives and negatives are included.  Exam:  Vitals:   02/22/24 1545  BP: 128/82  Pulse: 93  SpO2: 100%  Weight: 170 lb (77.1 kg)  Height: 5' 3.5 (1.613 m)      Body mass index is 29.64 kg/m.  General appearance:  Normal Thyroid:  Symmetrical, normal in size, without palpable masses or nodularity. Respiratory  Auscultation:  Clear without wheezing or rhonchi Cardiovascular  Auscultation:  Regular rate, without rubs, murmurs or gallops  Edema/varicosities:  Not grossly evident Abdominal  Soft,nontender, without masses, guarding or rebound.  Liver/spleen:  No organomegaly noted  Hernia:  None appreciated  Skin  Inspection:  Grossly normal Breasts: Examined lying and sitting.   Right: Without masses, retractions, nipple discharge or axillary adenopathy.   Left: Without masses, retractions, nipple discharge or axillary adenopathy. Pelvic: External genitalia:  no lesions               Urethra:  normal appearing urethra with no masses, tenderness or lesions              Bartholins and Skenes: normal                 Vagina: normal appearing vagina with normal color and discharge, no lesions              Cervix: no lesions Bimanual Exam:  Uterus:  no masses or tenderness              Adnexa: no mass, fullness, tenderness              Rectovaginal: Deferred              Anus:  normal, no lesions  Dereck Keas, CMA present as chaperone.   Assessment/Plan:  68 y.o. [id number] for annual exam.   Well female exam with routine gynecological exam - Plan: CBC with Differential/Platelet, Comprehensive metabolic panel with GFR. Education provided on SBEs, importance of preventative screenings, current guidelines, high calcium diet, regular exercise, and multivitamin daily. Recommend establishing with PCP.   Postmenopausal - Plan: DG Bone Density. No HRT, no bleeding.   Type 2 diabetes mellitus with hyperglycemia, without long-term current use of insulin (HCC) - Plan: Hemoglobin A1c. Recommend establishing with endo or functional medicine provider. Does not want medications. Very diligent with diet. Does not check blood sugar.   Screening for cervical cancer - Normal Pap history. Will repeat at 5-year interval per guidelines.   Screening for breast cancer - Normal mammogram history.  Continue annual screenings. Normal breast exam today.  Screening for colon  cancer - 04/2023 colonoscopy. 7-year recall.   Screening for osteoporosis - Normal Dexa in 2018. Recommend repeating DXA.   Return in about 1 year (around 02/21/2025) for Annual.       Kristina Hoffman [facility] [place], 4:08 PM 02/22/2024

## 2024-02-23 ENCOUNTER — Ambulatory Visit: Payer: Self-pay | Admitting: Nurse Practitioner

## 2024-02-23 LAB — CBC WITH DIFFERENTIAL/PLATELET
Absolute Lymphocytes: 3249 {cells}/uL (ref 850–3900)
Absolute Monocytes: 475 {cells}/uL (ref 200–950)
Basophils Absolute: 51 {cells}/uL (ref 0–200)
Basophils Relative: 0.7 %
Eosinophils Absolute: 51 {cells}/uL (ref 15–500)
Eosinophils Relative: 0.7 %
HCT: 40.6 % (ref 35.0–45.0)
Hemoglobin: 13 g/dL (ref 11.7–15.5)
MCH: 28.8 pg (ref 27.0–33.0)
MCHC: 32 g/dL (ref 32.0–36.0)
MCV: 90 fL (ref 80.0–100.0)
MPV: 12 fL (ref 7.5–12.5)
Monocytes Relative: 6.5 %
Neutro Abs: 3475 {cells}/uL (ref 1500–7800)
Neutrophils Relative %: 47.6 %
Platelets: 236 Thousand/uL (ref 140–400)
RBC: 4.51 Million/uL (ref 3.80–5.10)
RDW: 12.6 % (ref 11.0–15.0)
Total Lymphocyte: 44.5 %
WBC: 7.3 Thousand/uL (ref 3.8–10.8)

## 2024-02-23 LAB — COMPREHENSIVE METABOLIC PANEL WITH GFR
AG Ratio: 1.3 (calc) (ref 1.0–2.5)
ALT: 24 U/L (ref 6–29)
AST: 18 U/L (ref 10–35)
Albumin: 4.2 g/dL (ref 3.6–5.1)
Alkaline phosphatase (APISO): 89 U/L (ref 37–153)
BUN: 20 mg/dL (ref 7–25)
CO2: 26 mmol/L (ref 20–32)
Calcium: 9.9 mg/dL (ref 8.6–10.4)
Chloride: 102 mmol/L (ref 98–110)
Creat: 0.73 mg/dL (ref 0.50–1.05)
Globulin: 3.2 g/dL (ref 1.9–3.7)
Glucose, Bld: 312 mg/dL — ABNORMAL HIGH (ref 65–99)
Potassium: 4.8 mmol/L (ref 3.5–5.3)
Sodium: 137 mmol/L (ref 135–146)
Total Bilirubin: 0.5 mg/dL (ref 0.2–1.2)
Total Protein: 7.4 g/dL (ref 6.1–8.1)
eGFR: 91 mL/min/1.73m2 (ref >=60)

## 2024-02-23 LAB — HEMOGLOBIN A1C
Hgb A1c MFr Bld: 11.1 % — ABNORMAL HIGH (ref <5.7)
Mean Plasma Glucose: 272 mg/dL
eAG (mmol/L): 15.1 mmol/L

## 2024-03-31 ENCOUNTER — Encounter: Payer: Self-pay | Admitting: Nurse Practitioner

## 2024-03-31 LAB — HM MAMMOGRAPHY

## 2024-06-24 ENCOUNTER — Inpatient Hospital Stay (HOSPITAL_BASED_OUTPATIENT_CLINIC_OR_DEPARTMENT_OTHER): Admission: RE | Admit: 2024-06-24 | Source: Ambulatory Visit

## 2025-02-23 ENCOUNTER — Ambulatory Visit: Admitting: Nurse Practitioner
